# Patient Record
Sex: Male | Born: 1975 | Race: Black or African American | Hispanic: No | Marital: Single | Smoking: Current every day smoker
Health system: Southern US, Community
[De-identification: ages and names within clinical notes are randomized; demographics above are authoritative.]

## PROBLEM LIST (undated history)

## (undated) DIAGNOSIS — B20 Human immunodeficiency virus [HIV] disease: Secondary | ICD-10-CM

## (undated) DIAGNOSIS — Z21 Asymptomatic human immunodeficiency virus [HIV] infection status: Secondary | ICD-10-CM

## (undated) HISTORY — DX: Human immunodeficiency virus (HIV) disease: B20

## (undated) HISTORY — PX: OTHER SURGICAL HISTORY: SHX169

## (undated) HISTORY — DX: Asymptomatic human immunodeficiency virus (hiv) infection status: Z21

---

## 2004-02-11 ENCOUNTER — Encounter (INDEPENDENT_AMBULATORY_CARE_PROVIDER_SITE_OTHER): Payer: Self-pay | Admitting: *Deleted

## 2004-02-11 ENCOUNTER — Encounter: Admission: RE | Admit: 2004-02-11 | Discharge: 2004-02-11 | Payer: Self-pay | Admitting: Infectious Diseases

## 2004-02-11 LAB — CONVERTED CEMR LAB: CD4 T Cell Abs: 290

## 2004-03-22 ENCOUNTER — Ambulatory Visit: Payer: Self-pay | Admitting: Infectious Diseases

## 2004-04-22 ENCOUNTER — Ambulatory Visit: Payer: Self-pay | Admitting: Infectious Diseases

## 2004-05-13 ENCOUNTER — Ambulatory Visit: Payer: Self-pay | Admitting: Infectious Diseases

## 2004-05-31 ENCOUNTER — Ambulatory Visit: Payer: Self-pay | Admitting: Infectious Diseases

## 2004-06-14 ENCOUNTER — Ambulatory Visit: Payer: Self-pay | Admitting: Infectious Diseases

## 2004-07-12 ENCOUNTER — Ambulatory Visit: Payer: Self-pay | Admitting: Infectious Diseases

## 2004-08-11 ENCOUNTER — Ambulatory Visit: Payer: Self-pay | Admitting: Infectious Diseases

## 2004-08-23 ENCOUNTER — Ambulatory Visit: Payer: Self-pay | Admitting: Infectious Diseases

## 2004-09-29 ENCOUNTER — Ambulatory Visit: Payer: Self-pay | Admitting: Infectious Diseases

## 2004-11-10 ENCOUNTER — Ambulatory Visit: Payer: Self-pay | Admitting: Infectious Diseases

## 2004-11-29 ENCOUNTER — Ambulatory Visit: Payer: Self-pay | Admitting: Infectious Diseases

## 2005-02-21 ENCOUNTER — Ambulatory Visit: Payer: Self-pay | Admitting: Infectious Diseases

## 2005-04-13 ENCOUNTER — Ambulatory Visit: Payer: Self-pay | Admitting: Infectious Diseases

## 2005-05-10 ENCOUNTER — Ambulatory Visit: Payer: Self-pay | Admitting: Infectious Diseases

## 2005-08-09 ENCOUNTER — Ambulatory Visit: Payer: Self-pay | Admitting: Infectious Diseases

## 2005-11-01 ENCOUNTER — Ambulatory Visit: Payer: Self-pay | Admitting: Infectious Diseases

## 2006-01-24 ENCOUNTER — Encounter (INDEPENDENT_AMBULATORY_CARE_PROVIDER_SITE_OTHER): Payer: Self-pay | Admitting: *Deleted

## 2006-01-24 ENCOUNTER — Ambulatory Visit: Payer: Self-pay | Admitting: Infectious Diseases

## 2006-01-24 LAB — CONVERTED CEMR LAB: CD4 Count: 466 microliters

## 2006-02-23 ENCOUNTER — Emergency Department (HOSPITAL_COMMUNITY): Admission: EM | Admit: 2006-02-23 | Discharge: 2006-02-23 | Payer: Self-pay | Admitting: Family Medicine

## 2006-02-23 ENCOUNTER — Ambulatory Visit (HOSPITAL_COMMUNITY): Admission: RE | Admit: 2006-02-23 | Discharge: 2006-02-23 | Payer: Self-pay | Admitting: Family Medicine

## 2006-04-18 ENCOUNTER — Ambulatory Visit: Payer: Self-pay | Admitting: Infectious Diseases

## 2006-04-18 ENCOUNTER — Encounter (INDEPENDENT_AMBULATORY_CARE_PROVIDER_SITE_OTHER): Payer: Self-pay | Admitting: *Deleted

## 2006-04-18 LAB — CONVERTED CEMR LAB
ALT: 24 units/L (ref 0–40)
AST: 19 units/L (ref 0–37)
Albumin: 4.2 g/dL (ref 3.5–5.2)
Alkaline Phosphatase: 71 units/L (ref 39–117)
BUN: 13 mg/dL (ref 6–23)
Bilirubin Urine: NEGATIVE
Bilirubin, Direct: 0.1 mg/dL (ref 0.0–0.3)
CD4 Count: 645 microliters
CO2: 26 meq/L (ref 19–32)
Calcium: 9.3 mg/dL (ref 8.4–10.5)
Chloride: 106 meq/L (ref 96–112)
Cholesterol: 188 mg/dL (ref 0–200)
Creatinine, Ser: 0.85 mg/dL (ref 0.40–1.50)
Creatinine, Urine: 267 mg/dL
Glucose, Bld: 88 mg/dL (ref 70–99)
HCV Ab: NEGATIVE
HDL: 51 mg/dL (ref >39)
HIV 1 RNA Quant: 49 copies/mL
Hep A Total Ab: NEGATIVE
Hep B Core Total Ab: NEGATIVE
Hepatitis B Surface Ag: NEGATIVE
Hgb urine dipstick: NEGATIVE
Indirect Bilirubin: 0.2 mg/dL (ref 0.0–0.9)
Ketones, ur: NEGATIVE mg/dL
LDL Cholesterol: 124 mg/dL — ABNORMAL HIGH (ref 0–99)
Nitrite: NEGATIVE
Phosphorus: 3.5 mg/dL (ref 2.3–4.6)
Potassium: 4.2 meq/L (ref 3.5–5.3)
Protein, ur: NEGATIVE mg/dL
Sodium: 140 meq/L (ref 135–145)
Specific Gravity, Urine: 1.029 (ref 1.005–1.03)
Total Bilirubin: 0.3 mg/dL (ref 0.3–1.2)
Total CHOL/HDL Ratio: 3.7
Total Protein, Urine: 13
Total Protein: 6.9 g/dL (ref 6.0–8.3)
Triglycerides: 66 mg/dL (ref <150)
Urine Glucose: NEGATIVE mg/dL
Urobilinogen, UA: 0.2 (ref 0.0–1.0)
VLDL: 13 mg/dL (ref 0–40)
WBC Urine, dipstick: NEGATIVE
pH: 6 (ref 5.0–8.0)

## 2006-07-16 ENCOUNTER — Encounter (INDEPENDENT_AMBULATORY_CARE_PROVIDER_SITE_OTHER): Payer: Self-pay | Admitting: *Deleted

## 2006-07-16 ENCOUNTER — Ambulatory Visit: Payer: Self-pay | Admitting: Infectious Diseases

## 2006-07-16 LAB — CONVERTED CEMR LAB
AST: 19 units/L (ref 0–37)
Albumin: 4.2 g/dL (ref 3.5–5.2)
CO2: 25 meq/L (ref 19–32)
Chloride: 105 meq/L (ref 96–112)
HDL: 43 mg/dL (ref >39)
LDL Cholesterol: 102 mg/dL — ABNORMAL HIGH (ref 0–99)
Sodium: 141 meq/L (ref 135–145)
Total Bilirubin: 0.2 mg/dL — ABNORMAL LOW (ref 0.3–1.2)
Total CHOL/HDL Ratio: 3.9
Total Protein: 6.9 g/dL (ref 6.0–8.3)

## 2006-08-20 ENCOUNTER — Encounter (INDEPENDENT_AMBULATORY_CARE_PROVIDER_SITE_OTHER): Payer: Self-pay | Admitting: *Deleted

## 2006-08-20 LAB — CONVERTED CEMR LAB

## 2006-09-02 ENCOUNTER — Encounter (INDEPENDENT_AMBULATORY_CARE_PROVIDER_SITE_OTHER): Payer: Self-pay | Admitting: *Deleted

## 2006-10-11 ENCOUNTER — Ambulatory Visit: Payer: Self-pay | Admitting: Infectious Diseases

## 2006-10-11 DIAGNOSIS — B20 Human immunodeficiency virus [HIV] disease: Secondary | ICD-10-CM | POA: Insufficient documentation

## 2006-12-25 ENCOUNTER — Ambulatory Visit: Payer: Self-pay | Admitting: Infectious Diseases

## 2007-03-21 ENCOUNTER — Ambulatory Visit: Payer: Self-pay | Admitting: Infectious Diseases

## 2007-06-12 ENCOUNTER — Ambulatory Visit: Payer: Self-pay | Admitting: Infectious Diseases

## 2007-06-25 ENCOUNTER — Encounter (INDEPENDENT_AMBULATORY_CARE_PROVIDER_SITE_OTHER): Payer: Self-pay | Admitting: *Deleted

## 2007-06-25 ENCOUNTER — Encounter: Payer: Self-pay | Admitting: Infectious Diseases

## 2007-08-28 ENCOUNTER — Ambulatory Visit: Payer: Self-pay | Admitting: Infectious Diseases

## 2007-12-09 ENCOUNTER — Ambulatory Visit: Payer: Self-pay | Admitting: Infectious Diseases

## 2008-01-03 ENCOUNTER — Encounter: Payer: Self-pay | Admitting: Infectious Diseases

## 2008-02-24 ENCOUNTER — Ambulatory Visit: Payer: Self-pay | Admitting: Infectious Diseases

## 2008-02-25 ENCOUNTER — Ambulatory Visit: Payer: Self-pay | Admitting: Infectious Diseases

## 2008-06-03 ENCOUNTER — Ambulatory Visit: Payer: Self-pay | Admitting: Internal Medicine

## 2008-06-03 ENCOUNTER — Ambulatory Visit: Payer: Self-pay | Admitting: Infectious Diseases

## 2008-09-09 ENCOUNTER — Ambulatory Visit: Payer: Self-pay | Admitting: Infectious Diseases

## 2008-09-09 LAB — CONVERTED CEMR LAB
ALT: 17 units/L (ref 0–53)
AST: 19 units/L (ref 0–37)
Albumin: 4 g/dL (ref 3.5–5.2)
Alkaline Phosphatase: 81 units/L (ref 39–117)
Basophils Absolute: 0 10*3/uL (ref 0.0–0.1)
Glucose, Bld: 85 mg/dL (ref 70–99)
Hemoglobin: 13.3 g/dL (ref 13.0–17.0)
LDL Cholesterol: 95 mg/dL (ref 0–99)
Lymphocytes Relative: 42 % (ref 12–46)
Monocytes Absolute: 0.5 10*3/uL (ref 0.1–1.0)
Neutro Abs: 3.1 10*3/uL (ref 1.7–7.7)
Neutrophils Relative %: 49 % (ref 43–77)
Potassium: 4 meq/L (ref 3.5–5.3)
RBC: 5.35 M/uL (ref 4.22–5.81)
RDW: 16.1 % — ABNORMAL HIGH (ref 11.5–15.5)
Sodium: 138 meq/L (ref 135–145)
Total Bilirubin: 0.4 mg/dL (ref 0.3–1.2)
Total Protein: 6.8 g/dL (ref 6.0–8.3)
Triglycerides: 77 mg/dL (ref <150)
VLDL: 15 mg/dL (ref 0–40)

## 2009-04-09 ENCOUNTER — Ambulatory Visit: Payer: Self-pay | Admitting: Infectious Diseases

## 2009-04-09 ENCOUNTER — Encounter: Payer: Self-pay | Admitting: Infectious Diseases

## 2009-04-09 LAB — CONVERTED CEMR LAB
ALT: 20 units/L (ref 0–53)
AST: 23 units/L (ref 0–37)
Albumin: 4.4 g/dL (ref 3.5–5.2)
CO2: 24 meq/L (ref 19–32)
Calcium: 9.1 mg/dL (ref 8.4–10.5)
Chloride: 107 meq/L (ref 96–112)
Cholesterol: 171 mg/dL (ref 0–200)
Creatinine, Urine: 191 mg/dL
HIV 1 RNA Quant: 39 copies/mL
Potassium: 4.2 meq/L (ref 3.5–5.3)
Sodium: 140 meq/L (ref 135–145)
Total CHOL/HDL Ratio: 4.4
Total Protein, Urine: 9
Total Protein: 7 g/dL (ref 6.0–8.3)

## 2009-09-06 ENCOUNTER — Ambulatory Visit: Payer: Self-pay | Admitting: Infectious Diseases

## 2009-09-06 LAB — CONVERTED CEMR LAB
ALT: 24 units/L (ref 0–53)
AST: 27 units/L (ref 0–37)
Basophils Relative: 0 % (ref 0–1)
Chloride: 107 meq/L (ref 96–112)
Creatinine, Ser: 0.79 mg/dL (ref 0.40–1.50)
Eosinophils Absolute: 0.1 10*3/uL (ref 0.0–0.7)
LDL Cholesterol: 109 mg/dL — ABNORMAL HIGH (ref 0–99)
Lymphs Abs: 1.9 10*3/uL (ref 0.7–4.0)
MCV: 73.9 fL — ABNORMAL LOW (ref >=78.0)
Monocytes Relative: 7 % (ref 3–12)
Neutro Abs: 4.4 10*3/uL (ref 1.7–7.7)
Neutrophils Relative %: 63 % (ref 43–77)
Platelets: 337 10*3/uL (ref 150–400)
RBC: 5.56 M/uL (ref 4.22–5.81)
Sodium: 141 meq/L (ref 135–145)
Total Bilirubin: 0.3 mg/dL (ref 0.3–1.2)
Total CHOL/HDL Ratio: 3.5
Total Protein: 7 g/dL (ref 6.0–8.3)
VLDL: 12 mg/dL (ref 0–40)
WBC: 7 10*3/uL (ref 4.0–10.5)

## 2009-12-09 ENCOUNTER — Ambulatory Visit: Payer: Self-pay | Admitting: Infectious Diseases

## 2009-12-09 ENCOUNTER — Encounter: Payer: Self-pay | Admitting: Infectious Diseases

## 2009-12-09 LAB — CONVERTED CEMR LAB
ALT: 17 units/L (ref 0–53)
AST: 19 units/L (ref 0–37)
Alkaline Phosphatase: 106 units/L (ref 39–117)
Cholesterol: 166 mg/dL (ref 0–200)
Creatinine, Ser: 0.9 mg/dL (ref 0.40–1.50)
Creatinine, Urine: 232.5 mg/dL
HIV 1 RNA Quant: 39 copies/mL
Hep B Core Total Ab: NEGATIVE
Hepatitis B Surface Ag: NEGATIVE
LDL Cholesterol: 92 mg/dL (ref 0–99)
Sodium: 140 meq/L (ref 135–145)
Total Bilirubin: 0.4 mg/dL (ref 0.3–1.2)
Total CHOL/HDL Ratio: 2.9
Total Protein, Urine: 20
VLDL: 17 mg/dL (ref 0–40)

## 2010-05-30 ENCOUNTER — Encounter: Payer: Self-pay | Admitting: Infectious Diseases

## 2010-05-30 ENCOUNTER — Ambulatory Visit: Payer: Self-pay | Admitting: Infectious Diseases

## 2010-05-30 LAB — CONVERTED CEMR LAB
AST: 23 units/L (ref 0–37)
Albumin: 4.2 g/dL (ref 3.5–5.2)
BUN: 11 mg/dL (ref 6–23)
CD4 Count: 565 microliters
CO2: 24 meq/L (ref 19–32)
Calcium: 8.8 mg/dL (ref 8.4–10.5)
Chloride: 104 meq/L (ref 96–112)
Cholesterol: 176 mg/dL (ref 0–200)
HDL: 43 mg/dL (ref >39)
HIV 1 RNA Quant: 34262 copies/mL
Potassium: 4.2 meq/L (ref 3.5–5.3)

## 2010-07-06 ENCOUNTER — Encounter (INDEPENDENT_AMBULATORY_CARE_PROVIDER_SITE_OTHER): Payer: Self-pay | Admitting: *Deleted

## 2010-07-24 LAB — CONVERTED CEMR LAB
ALT: 17 units/L (ref 0–53)
ALT: 19 units/L (ref 0–53)
ALT: 20 units/L (ref 0–53)
ALT: 20 units/L (ref 0–53)
ALT: 21 units/L (ref 0–53)
AST: 20 units/L (ref 0–37)
AST: 20 units/L (ref 0–37)
AST: 21 units/L (ref 0–37)
AST: 28 units/L (ref 0–37)
Albumin: 3.9 g/dL (ref 3.5–5.2)
Albumin: 4.2 g/dL (ref 3.5–5.2)
Albumin: 4.2 g/dL (ref 3.5–5.2)
Albumin: 4.3 g/dL (ref 3.5–5.2)
Albumin: 4.3 g/dL (ref 3.5–5.2)
Albumin: 4.3 g/dL (ref 3.5–5.2)
Albumin: 4.5 g/dL (ref 3.5–5.2)
Alkaline Phosphatase: 78 units/L (ref 39–117)
Alkaline Phosphatase: 88 units/L (ref 39–117)
Alkaline Phosphatase: 89 units/L (ref 39–117)
Alkaline Phosphatase: 96 units/L (ref 39–117)
BUN: 11 mg/dL (ref 6–23)
BUN: 13 mg/dL (ref 6–23)
BUN: 15 mg/dL (ref 6–23)
BUN: 18 mg/dL (ref 6–23)
BUN: 19 mg/dL (ref 6–23)
Bilirubin Urine: NEGATIVE
Bilirubin Urine: NEGATIVE
Bilirubin Urine: NEGATIVE
Bilirubin Urine: NEGATIVE
Bilirubin, Direct: 0.1 mg/dL (ref 0.0–0.3)
Bilirubin, Direct: 0.1 mg/dL (ref 0.0–0.3)
Bilirubin, Direct: <0.1 mg/dL (ref 0.0–0.3)
Bilirubin, Direct: <0.1 mg/dL (ref 0.0–0.3)
CD4 Count: 549 microliters
CD4 Count: 618 microliters
CD4 Count: 626 microliters
CD4 Count: 840 microliters
CD4 Count: 876 microliters
CO2: 22 meq/L (ref 19–32)
CO2: 24 meq/L (ref 19–32)
CO2: 24 meq/L (ref 19–32)
CO2: 24 meq/L (ref 19–32)
CO2: 25 meq/L (ref 19–32)
CO2: 27 meq/L (ref 19–32)
Calcium: 8.7 mg/dL (ref 8.4–10.5)
Calcium: 8.9 mg/dL (ref 8.4–10.5)
Calcium: 9.1 mg/dL (ref 8.4–10.5)
Calcium: 9.1 mg/dL (ref 8.4–10.5)
Chloride: 105 meq/L (ref 96–112)
Chloride: 105 meq/L (ref 96–112)
Chloride: 106 meq/L (ref 96–112)
Chloride: 106 meq/L (ref 96–112)
Chloride: 108 meq/L (ref 96–112)
Chloride: 108 meq/L (ref 96–112)
Cholesterol: 154 mg/dL (ref 0–200)
Cholesterol: 158 mg/dL (ref 0–200)
Cholesterol: 176 mg/dL (ref 0–200)
Cholesterol: 177 mg/dL (ref 0–200)
Creatinine, Ser: 0.68 mg/dL (ref 0.40–1.50)
Creatinine, Ser: 0.7 mg/dL (ref 0.40–1.50)
Creatinine, Ser: 0.72 mg/dL (ref 0.40–1.50)
Creatinine, Ser: 0.77 mg/dL (ref 0.40–1.50)
Creatinine, Ser: 0.8 mg/dL (ref 0.40–1.50)
Creatinine, Urine: 185.9 mg/dL
Glucose, Bld: 100 mg/dL — ABNORMAL HIGH (ref 70–99)
Glucose, Bld: 71 mg/dL (ref 70–99)
Glucose, Bld: 86 mg/dL (ref 70–99)
Glucose, Bld: 88 mg/dL (ref 70–99)
Glucose, Bld: 91 mg/dL (ref 70–99)
Glucose, Bld: 95 mg/dL (ref 70–99)
HCV Ab: NEGATIVE
HDL: 46 mg/dL (ref >39)
HDL: 47 mg/dL (ref >39)
HDL: 58 mg/dL (ref >39)
HDL: 59 mg/dL (ref >39)
HIV 1 RNA Quant: 39 copies/mL
HIV 1 RNA Quant: 49 copies/mL
HIV 1 RNA Quant: 49 copies/mL
HIV 1 RNA Quant: 49 copies/mL
HIV 1 RNA Quant: 49 copies/mL
Hemoglobin, Urine: NEGATIVE
Hemoglobin, Urine: NEGATIVE
Hemoglobin, Urine: NEGATIVE
Hepatitis B Surface Ag: NEGATIVE
Indirect Bilirubin: 0.2 mg/dL (ref 0.0–0.9)
Indirect Bilirubin: 0.2 mg/dL (ref 0.0–0.9)
Indirect Bilirubin: 0.3 mg/dL (ref 0.0–0.9)
Ketones, ur: NEGATIVE mg/dL
Ketones, ur: NEGATIVE mg/dL
LDL Cholesterol: 104 mg/dL — ABNORMAL HIGH (ref 0–99)
LDL Cholesterol: 106 mg/dL — ABNORMAL HIGH (ref 0–99)
LDL Cholesterol: 85 mg/dL (ref 0–99)
LDL Cholesterol: 93 mg/dL (ref 0–99)
LDL Cholesterol: 99 mg/dL (ref 0–99)
LDL Cholesterol: 99 mg/dL (ref 0–99)
Leukocytes, UA: NEGATIVE
Leukocytes, UA: NEGATIVE
Nitrite: NEGATIVE
Nitrite: NEGATIVE
Phosphorus: 3.1 mg/dL (ref 2.3–4.6)
Phosphorus: 3.7 mg/dL (ref 2.3–4.6)
Phosphorus: 3.9 mg/dL (ref 2.3–4.6)
Phosphorus: 4.1 mg/dL (ref 2.3–4.6)
Potassium: 4 meq/L (ref 3.5–5.3)
Potassium: 4.2 meq/L (ref 3.5–5.3)
Potassium: 4.3 meq/L (ref 3.5–5.3)
Potassium: 4.3 meq/L (ref 3.5–5.3)
Potassium: 4.4 meq/L (ref 3.5–5.3)
Potassium: 4.5 meq/L (ref 3.5–5.3)
Potassium: 4.5 meq/L (ref 3.5–5.3)
Protein, ur: NEGATIVE mg/dL
Protein, ur: NEGATIVE mg/dL
Protein, ur: NEGATIVE mg/dL
Sodium: 139 meq/L (ref 135–145)
Sodium: 140 meq/L (ref 135–145)
Sodium: 140 meq/L (ref 135–145)
Sodium: 140 meq/L (ref 135–145)
Sodium: 143 meq/L (ref 135–145)
Specific Gravity, Urine: 1.025 (ref 1.005–1.03)
Specific Gravity, Urine: 1.029 (ref 1.005–1.03)
Specific Gravity, Urine: 1.033 — ABNORMAL HIGH (ref 1.005–1.03)
Total Bilirubin: 0.2 mg/dL — ABNORMAL LOW (ref 0.3–1.2)
Total Bilirubin: 0.3 mg/dL (ref 0.3–1.2)
Total Bilirubin: 0.3 mg/dL (ref 0.3–1.2)
Total Bilirubin: 0.3 mg/dL (ref 0.3–1.2)
Total Bilirubin: 0.4 mg/dL (ref 0.3–1.2)
Total CHOL/HDL Ratio: 3
Total CHOL/HDL Ratio: 3.3
Total CHOL/HDL Ratio: 3.3
Total CHOL/HDL Ratio: 3.4
Total Protein: 6.7 g/dL (ref 6.0–8.3)
Total Protein: 6.9 g/dL (ref 6.0–8.3)
Total Protein: 6.9 g/dL (ref 6.0–8.3)
Total Protein: 7.1 g/dL (ref 6.0–8.3)
Total Protein: 7.4 g/dL (ref 6.0–8.3)
Triglycerides: 128 mg/dL (ref <150)
Triglycerides: 46 mg/dL (ref <150)
Triglycerides: 66 mg/dL (ref <150)
Urine Glucose: NEGATIVE mg/dL
Urine Glucose: NEGATIVE mg/dL
Urine Glucose: NEGATIVE mg/dL
Urine Glucose: NEGATIVE mg/dL
Urobilinogen, UA: 0.2 (ref 0.0–1.0)
Urobilinogen, UA: 0.2 (ref 0.0–1.0)
Urobilinogen, UA: 1 (ref 0.0–1.0)
VLDL: 10 mg/dL (ref 0–40)
VLDL: 13 mg/dL (ref 0–40)
VLDL: 24 mg/dL (ref 0–40)
VLDL: 26 mg/dL (ref 0–40)
VLDL: 9 mg/dL (ref 0–40)
VLDL: 9 mg/dL (ref 0–40)
pH: 6 (ref 5.0–8.0)
pH: 6 (ref 5.0–8.0)
pH: 7.5 (ref 5.0–8.0)

## 2010-07-27 NOTE — Assessment & Plan Note (Addendum)
Summary: FLU SHOT  Prior Medications: ATRIPLA 600-200-300 MG TABS (EFAVIRENZ-EMTRICITAB-TENOFOVIR) take 1 by mouth daily on an empty stomach  Immunizations Administered:  Influenza Vaccine # 1:    Vaccine Type: Fluvax Non-MCR    Site: left deltoid    Mfr: novartis    Dose: 0.5 ml    Route: IM    Given by: Deirdre Evener RN    Exp. Date: 09/25/2010    Lot #: [id number]    VIS given: 01/18/10 version given May 30, 2010.  Flu Vaccine Consent Questions:    Do you have a history of severe allergic reactions to this vaccine? no    Any prior history of allergic reactions to egg and/or gelatin? no    Do you have a sensitivity to the preservative Thimersol? no    Do you have a past history of Guillan-Barre Syndrome? no    Do you currently have an acute febrile illness? no    Have you ever had a severe reaction to latex? no    Vaccine information given and explained to patient? yes  Orders Added: 1)  Influenza Vaccine NON MCR [00028]

## 2010-07-27 NOTE — Assessment & Plan Note (Signed)
Summary: STUDY APPT/ LH   Vital Signs:  Patient profile:   35 year old male Weight:      127.25 pounds (57.84 kg) BMI:     20.30 Temp:     97.9 degrees F oral Pulse rate:   80 / minute BP sitting:   117 / 67  (left arm) Is Patient Diabetic? No Nutritional Status BMI of 19 -24 = normal  Does patient need assistance? Functional Status Self care Ambulation Normal   Patient here for week 304 ALLRT study visit. He denies any current complaints. He has been without his atripla for the past month due to financial reasons. I gave him a co-pay assistance card to use for his copays that should cover the full amount $100/month.Deirdre Evener RN  May 30, 2010 10:34 AM    Other Orders: Est. Patient Research Study [phone]) T-Comprehensive Metabolic Panel [id number]) T-Lipid Profile [id number])

## 2010-07-27 NOTE — Miscellaneous (Signed)
Summary: HIV-1 RNA, CD4 (RESEARCH)  Clinical Lists Changes  Observations: Added new observation of CD4 COUNT: 862 microliters (12/09/2009 9:22) Added new observation of HIV1RNA QA: 39 copies/mL (12/09/2009 9:22)

## 2010-07-27 NOTE — Assessment & Plan Note (Signed)
Summary: STUDY APPT/ LH   Vital Signs:  Patient profile:   35 year old male Height:      66.5 inches (168.91 cm) Weight:      122.75 pounds (55.80 kg) BMI:     19.59 Temp:     98.0 degrees F oral Pulse rate:   94 / minute BP sitting:   129 / 77  (left arm) Is Patient Diabetic? No Pain Assessment Patient in pain? no      Nutritional Status BMI of 19 -24 = normal  Does patient need assistance? Functional Status Self care Ambulation Normal   Patient here for week 288 ALLRT study visit. He denies any major problems, but has started having cold signs yesterday of cough, nasal stuffiness and headache. He has been very adherent.Deirdre Evener RN  December 09, 2009 2:30 PM    Other Orders: Est. Patient Research Study [id number]) T-Comprehensive Metabolic Panel [id number]) T-Lipid Profile [phone]) T-Urine Protein [id number]) T-Urine Creatinine [id number]) T-Hepatitis B Surface Antigen [phone]) T-Hepatitis B Core Antibody [id number]) T-Hepatitis C Antibody ([id number]) T-Hepatitis A Antibody ([id number])  Process Orders Check Orders Results:     [facility]: ABN not required for this insurance Order queued for requisitioning for [facility]: December 09, 2009 2:10 PM  Tests Sent for requisitioning (December 09, 2009 2:10 PM):     12/09/2009: [facility] -- T-Comprehensive Metabolic Panel [[id number]] (signed)     12/09/2009: [facility] -- T-Lipid Profile [phone] (signed)     12/09/2009: [facility] -- T-Urine Protein [phone] (signed)     12/09/2009: [facility] -- T-Urine Creatinine [[id number]] (signed)     12/09/2009: [facility] -- T-Hepatitis B Surface Antigen [[id number]] (signed)     12/09/2009: [facility] -- T-Hepatitis B Core Antibody [[id number]] (signed)     12/09/2009: [facility] -- T-Hepatitis C Antibody [[id number]]  (signed)     12/09/2009: [facility] -- T-Hepatitis A Antibody [[id number]] (signed)

## 2010-07-27 NOTE — Assessment & Plan Note (Signed)
Summary: STUDY APPT/ LH   Vital Signs:  Patient profile:   35 year old male Weight:      123.3 pounds (56.05 kg) BMI:     19.97 Temp:     97.0 degrees F oral Pulse rate:   95 / minute BP sitting:   121 / 74  (right arm) Is Patient Diabetic? No Research Study Name: ALLRT Pain Assessment Patient in pain? no      Nutritional Status BMI of 19 -24 = normal  Does patient need assistance? Functional Status Self care Ambulation Normal  Patient here for week 272 ALLRT study visit. Denies any problems or complaints. He has not been seen by Dr. Maurice March in over a year, so He has scheduled a followup appt in June, a few days after his next research appt.Deirdre Evener RN  September 06, 2009 1:26 PM   Other Orders: Est. Patient Research Study [id number]) T-Comprehensive Metabolic Panel [phone]) T-CBC w/Diff [id number]) T-Lipid Profile [id number]) Process Orders Check Orders Results:     [facility]: ABN not required for this insurance Tests Sent for requisitioning (September 06, 2009 1:23 PM):     09/06/2009: [facility] -- T-Comprehensive Metabolic Panel [[id number]] (signed)     09/06/2009: [facility] -- T-CBC w/Diff [[id number]] (signed)     09/06/2009: [facility] -- T-Lipid Profile [phone] (signed)

## 2010-07-27 NOTE — Assessment & Plan Note (Signed)
Summary: F/U   CC:  follow-up visit.  Preventive Screening-Counseling & Management  Alcohol-Tobacco     Alcohol drinks/day: <1     Smoking Status: current     Smoking Cessation Counseling: yes     Packs/Day: 1/2  Caffeine-Diet-Exercise     Caffeine use/day: yes     Does Patient Exercise: yes     Type of exercise: lifting     Exercise (avg: min/session): >60     Times/week: 5  Hep-HIV-STD-Contraception     HIV Risk: no risk noted  Safety-Violence-Falls     Seat Belt Use: no  Comments: declined condoms      Sexual History:  n/a.        Drug Use:  never.    Social History: Sexual History:  n/a Drug Use:  never  Vital Signs:  Patient profile:   35 year old male Height:      66.5 inches (168.91 cm) Weight:      121.5 pounds (55.23 kg) BMI:     19.39 Temp:     98.0 degrees F (36.67 degrees C) oral Pulse rate:   96 / minute BP sitting:   135 / 89  (left arm) Cuff size:   regular  Vitals Entered By: Jennet Maduro RN (December 09, 2009 3:15 PM) CC: follow-up visit Is Patient Diabetic? No Pain Assessment Patient in pain? no      Nutritional Status BMI of 19 -24 = normal Nutritional Status Detail appetite "good"  Have you ever been in a relationship where you felt threatened, hurt or afraid?No   Does patient need assistance? Functional Status Self care Ambulation Normal Comments no missed doses of rxes    Other Orders: T-RPR (Syphilis) ([id number]) Est. Patient Level IV (82956) Future Orders: T-CD4SP ([facility]) (CD4SP) ... 05/30/2010 T-HIV Viral Load [id number]) ... 05/30/2010  Patient Instructions: 1)  Please schedule a follow-up appointment in 5-6 months. 2)  Be sure to return for lab work one (1) week before your next appointment as scheduled.   Process Orders Check Orders Results:     [facility]: ABN not required for this insurance Tests Sent for requisitioning (May 30, 2010 11:10 AM):     12/09/2009: [facility] -- T-RPR (Syphilis) [id number] (signed)     05/30/2010: [facility] -- T-HIV Viral Load [phone] (signed)

## 2010-07-28 NOTE — Miscellaneous (Signed)
Summary: HIV-1 RNA, CD4 (RESEARCH)  Clinical Lists Changes  Observations: Added new observation of CD4 COUNT: 565 microliters (05/30/2010 15:14) Added new observation of HIV1RNA QA: 34,262 copies/mL (05/30/2010 15:14)

## 2010-07-28 NOTE — Miscellaneous (Signed)
  Clinical Lists Changes 

## 2010-09-05 ENCOUNTER — Ambulatory Visit: Payer: Self-pay

## 2010-09-06 ENCOUNTER — Encounter: Payer: Self-pay | Admitting: Infectious Diseases

## 2010-09-06 ENCOUNTER — Ambulatory Visit: Payer: Self-pay

## 2010-09-13 NOTE — Assessment & Plan Note (Signed)
Summary: STUDY APPT   Vital Signs:  Patient profile:   35 year old male Weight:      121.75 pounds (55.05 kg) Temp:     98.1 degrees F oral Pulse rate:   93 / minute BP sitting:   139 / 92  (right arm)  Vitals Entered By: Tacey Heap RN (September 06, 2010 11:38 AM)  09/06/10 @ 1100: Pt here for study A5001, visit 320. Pt assessment unchanged since last study visit. Pt denies any new dx, medications, signs, or symptoms. Pt stated financial issues have improved. Labs were drawn with no problems. Pt received $20.00 for visit. Next study appointment scheduled for Monday, December 12, 2010 at 9:30am. Tacey Heap RN  September 06, 2010 11:43 AM     Other Orders: T-Comprehensive Metabolic Panel [id number] [id number]) T-Lipid Profile [id number]) T-CBC w/Diff [id number])   Orders Added: 1)  T-Comprehensive Metabolic Panel [[id number]] 2)  T-Lipid Profile [[id number]] 3)  T-CBC w/Diff [[id number]]

## 2010-11-07 ENCOUNTER — Other Ambulatory Visit: Payer: Self-pay | Admitting: Infectious Diseases

## 2010-11-07 DIAGNOSIS — B20 Human immunodeficiency virus [HIV] disease: Secondary | ICD-10-CM

## 2010-12-19 ENCOUNTER — Encounter: Payer: Self-pay | Admitting: *Deleted

## 2010-12-19 ENCOUNTER — Ambulatory Visit (INDEPENDENT_AMBULATORY_CARE_PROVIDER_SITE_OTHER): Payer: Self-pay | Admitting: *Deleted

## 2010-12-19 VITALS — BP 133/88 | HR 86 | Temp 98.1°F | Wt 125.5 lb

## 2010-12-19 DIAGNOSIS — B20 Human immunodeficiency virus [HIV] disease: Secondary | ICD-10-CM

## 2010-12-19 LAB — COMPREHENSIVE METABOLIC PANEL
ALT: 16 U/L (ref 0–53)
AST: 16 U/L (ref 0–37)
Albumin: 4.1 g/dL (ref 3.5–5.2)
BUN: 11 mg/dL (ref 6–23)
Calcium: 9.1 mg/dL (ref 8.4–10.5)
Chloride: 107 mEq/L (ref 96–112)
Potassium: 4 mEq/L (ref 3.5–5.3)

## 2010-12-19 NOTE — Progress Notes (Signed)
Patient here for week 336 ALLRT study appointment. He denies any problems at present and assessment is WNL. He started back on his meds the first of April. I instructed him to make an appt with the MD since it had been 1 year since his last appointment.

## 2010-12-20 LAB — CREATININE, URINE, RANDOM: Creatinine, Urine: 262.8 mg/dL

## 2010-12-20 LAB — PROTEIN, URINE, RANDOM: Total Protein, Urine: 14 mg/dL

## 2010-12-29 ENCOUNTER — Encounter: Payer: Self-pay | Admitting: Infectious Diseases

## 2010-12-29 LAB — HIV-1 RNA QUANT-NO REFLEX-BLD: HIV-1 RNA Viral Load: <40

## 2010-12-29 LAB — CD4/CD8 (T-HELPER/T-SUPPRESSOR CELL)
CD4%: 34.9
CD4: 593

## 2011-04-03 ENCOUNTER — Encounter: Payer: Self-pay | Admitting: *Deleted

## 2011-04-03 ENCOUNTER — Ambulatory Visit (INDEPENDENT_AMBULATORY_CARE_PROVIDER_SITE_OTHER): Payer: Self-pay | Admitting: *Deleted

## 2011-04-03 DIAGNOSIS — B20 Human immunodeficiency virus [HIV] disease: Secondary | ICD-10-CM

## 2011-04-03 DIAGNOSIS — Z299 Encounter for prophylactic measures, unspecified: Secondary | ICD-10-CM

## 2011-04-03 DIAGNOSIS — Z23 Encounter for immunization: Secondary | ICD-10-CM

## 2011-04-03 LAB — LIPID PANEL
Cholesterol: 178 mg/dL (ref 0–200)
HDL: 42 mg/dL (ref >39)
Total CHOL/HDL Ratio: 4.2 Ratio
Triglycerides: 88 mg/dL (ref <150)
VLDL: 18 mg/dL (ref 0–40)

## 2011-04-03 LAB — COMPREHENSIVE METABOLIC PANEL
BUN: 12 mg/dL (ref 6–23)
CO2: 24 mEq/L (ref 19–32)
Calcium: 9.3 mg/dL (ref 8.4–10.5)
Chloride: 105 mEq/L (ref 96–112)
Creat: 0.7 mg/dL (ref 0.50–1.35)
Glucose, Bld: 75 mg/dL (ref 70–99)
Total Bilirubin: 0.3 mg/dL (ref 0.3–1.2)

## 2011-04-03 NOTE — Progress Notes (Signed)
04/03/2011 @ 1030: Pt here for research study A5001, week 352. Assessment unchanged since last study visit. Pt denies any new findings or problems. Vital signs stable; non-fasting labs drawn; Administered flu vaccine; Pt received $20.00 gift card for visit. Next appointment scheduled for Monday, July 24, 2011 at 1100 am. -- Tacey Heap RN II

## 2011-05-17 ENCOUNTER — Encounter: Payer: Self-pay | Admitting: Infectious Diseases

## 2011-05-17 LAB — CD4/CD8 (T-HELPER/T-SUPPRESSOR CELL)
CD4: 515
CD8 % Suppressor T Cell: 53.7

## 2011-08-01 ENCOUNTER — Emergency Department (HOSPITAL_COMMUNITY)
Admission: EM | Admit: 2011-08-01 | Discharge: 2011-08-02 | Disposition: A | Discharge status: Home / Self Care | Payer: BC Managed Care – PPO | Attending: Emergency Medicine | Admitting: Emergency Medicine

## 2011-08-01 ENCOUNTER — Encounter (HOSPITAL_COMMUNITY): Payer: Self-pay | Admitting: Emergency Medicine

## 2011-08-01 DIAGNOSIS — R109 Unspecified abdominal pain: Secondary | ICD-10-CM | POA: Insufficient documentation

## 2011-08-01 DIAGNOSIS — R197 Diarrhea, unspecified: Secondary | ICD-10-CM

## 2011-08-01 DIAGNOSIS — Z79899 Other long term (current) drug therapy: Secondary | ICD-10-CM | POA: Insufficient documentation

## 2011-08-01 DIAGNOSIS — F17200 Nicotine dependence, unspecified, uncomplicated: Secondary | ICD-10-CM | POA: Insufficient documentation

## 2011-08-01 DIAGNOSIS — R112 Nausea with vomiting, unspecified: Secondary | ICD-10-CM | POA: Insufficient documentation

## 2011-08-01 MED ORDER — SODIUM CHLORIDE 0.9 % IV BOLUS (SEPSIS)
1000.0000 mL | Freq: Once | INTRAVENOUS | Status: AC
  Administered 2011-08-01: 1000 mL via INTRAVENOUS

## 2011-08-01 MED ORDER — LOPERAMIDE HCL 2 MG PO CAPS
2.0000 mg | ORAL_CAPSULE | Freq: Once | ORAL | Status: AC
  Administered 2011-08-01: 2 mg via ORAL
  Filled 2011-08-01: qty 1

## 2011-08-01 MED ORDER — ONDANSETRON HCL 4 MG/2ML IJ SOLN
4.0000 mg | Freq: Once | INTRAMUSCULAR | Status: AC
  Administered 2011-08-01: 4 mg via INTRAVENOUS
  Filled 2011-08-01: qty 2

## 2011-08-01 NOTE — ED Provider Notes (Signed)
History     CSN: [id number]  Arrival date & time 08/01/11  4098   First MD Initiated Contact with Patient 08/01/11 2259      No chief complaint on file.   (Consider location/radiation/quality/duration/timing/severity/associated sxs/prior treatment) The history is provided by the patient.   nausea vomiting diarrhea for 2 days. Mostly diarrhea. No fevers. Mild abdominal pain. No fevers.  no blood in stool or vomiting. She he states he has had sick contacts. He does not feel dizzy.  History reviewed. No pertinent past medical history.  Past Surgical History  Procedure Date  . Heart surgery as an infant      History reviewed. No pertinent family history.  History  Substance Use Topics  . Smoking status: Current Everyday Smoker -- 0.5 packs/day    Types: Cigarettes  . Smokeless tobacco: Not on file  . Alcohol Use: Yes     weekly      Review of Systems  Constitutional: Negative for activity change and appetite change.  HENT: Negative for neck stiffness.   Eyes: Negative for pain.  Respiratory: Negative for chest tightness and shortness of breath.   Cardiovascular: Negative for chest pain and leg swelling.  Gastrointestinal: Positive for nausea, vomiting and diarrhea. Negative for abdominal pain.  Genitourinary: Negative for flank pain.  Musculoskeletal: Negative for back pain.  Skin: Negative for rash.  Neurological: Negative for weakness, numbness and headaches.  Psychiatric/Behavioral: Negative for behavioral problems.    Allergies  Review of patient's allergies indicates no known allergies.  Home Medications   Current Outpatient Rx  Name Route Sig Dispense Refill  . ATRIPLA 600-200-300 MG PO TABS  TAKE 1 TABLET DAILY ON AN EMPTY STOMACH (MUST KEEP APPOINTMENTS) 3 tablet 2  . LOPERAMIDE HCL 2 MG PO CAPS Oral Take 1 capsule (2 mg total) by mouth 4 (four) times daily as needed for diarrhea or loose stools. 12 capsule 0  . ONDANSETRON 8 MG PO TBDP Oral Take 1 tablet  (8 mg total) by mouth every 8 (eight) hours as needed for nausea. 20 tablet 0    BP 127/77  Pulse 82  Temp(Src) 97.6 F (36.4 C) (Oral)  Resp 16  Wt 130 lb (58.968 kg)  SpO2 100%  Physical Exam  Nursing note and vitals reviewed. Constitutional: He is oriented to person, place, and time. He appears well-developed and well-nourished.  HENT:  Head: Normocephalic and atraumatic.  Eyes: EOM are normal. Pupils are equal, round, and reactive to light.  Cardiovascular: Regular rhythm and normal heart sounds.   No murmur heard. Pulmonary/Chest: Effort normal and breath sounds normal.  Abdominal: Soft. Bowel sounds are normal. He exhibits no distension and no mass. There is no tenderness. There is no rebound and no guarding.  Musculoskeletal: Normal range of motion. He exhibits no edema.  Neurological: He is alert and oriented to person, place, and time. No cranial nerve deficit.  Skin: Skin is warm and dry.  Psychiatric: He has a normal mood and affect.    ED Course  Procedures (including critical care time)  Labs Reviewed - No data to display No results found.   1. Nausea vomiting and diarrhea       MDM  Nausea vomiting diarrhea. Tolerating orals and feels better after IV fluids here. He is HIV positive, has good CD4 count. He'll be discharged home with nausea medicines and Imodium. He'll followup as needed        Juliet Rude. Rubin Payor, MD 08/02/11 [phone]

## 2011-08-01 NOTE — ED Notes (Signed)
Pt states he woke up with an upset stomach yesterday morning and about an hour after that started having diarrhea  Pt states everytime he ate yesterday he could not hold it down  Pt states he only has vomited x 2 but has had several episodes of diarrhea

## 2011-08-02 MED ORDER — ONDANSETRON 8 MG PO TBDP: 8.0000 mg | ORAL_TABLET | Freq: Three times a day (TID) | ORAL | Status: AC | PRN

## 2011-08-02 MED ORDER — LOPERAMIDE HCL 2 MG PO CAPS: 2.0000 mg | ORAL_CAPSULE | Freq: Four times a day (QID) | ORAL | Status: AC | PRN

## 2011-08-02 NOTE — ED Notes (Signed)
Pt tolerating PO fluids

## 2011-08-03 ENCOUNTER — Ambulatory Visit (INDEPENDENT_AMBULATORY_CARE_PROVIDER_SITE_OTHER): Payer: BC Managed Care – PPO | Admitting: *Deleted

## 2011-08-03 VITALS — BP 130/77 | HR 82 | Temp 97.6°F | Wt 126.5 lb

## 2011-08-03 DIAGNOSIS — B20 Human immunodeficiency virus [HIV] disease: Secondary | ICD-10-CM

## 2011-08-03 NOTE — Progress Notes (Signed)
Patient here for week 368 ALLRT study visit. He denies any problems, but does say he has not restarted his meds. He could not afford the copays, so I gave him a copay assistance card for his atripla. He has not seen a physician in quite awhile, so an appt was made for him to see Dr. Luciana Axe in a few weeks. At that time he can get a new scrip written and sent off to his mail order pharmacy. We did not do the HIV VL or CD4 at this visit, since it was not required on study. He will return though on March 5 th for the next study visit and all the annual labs will be covered at that visit.

## 2011-08-04 LAB — COMPREHENSIVE METABOLIC PANEL
Albumin: 3.9 g/dL (ref 3.5–5.2)
BUN: 13 mg/dL (ref 6–23)
CO2: 28 mEq/L (ref 19–32)
Calcium: 9.1 mg/dL (ref 8.4–10.5)
Glucose, Bld: 72 mg/dL (ref 70–99)
Potassium: 4.1 mEq/L (ref 3.5–5.3)
Sodium: 140 mEq/L (ref 135–145)
Total Protein: 6.5 g/dL (ref 6.0–8.3)

## 2011-08-04 LAB — CBC WITH DIFFERENTIAL/PLATELET
Basophils Relative: 1 % (ref 0–1)
HCT: 37 % — ABNORMAL LOW (ref 39.0–52.0)
Hemoglobin: 11.9 g/dL — ABNORMAL LOW (ref 13.0–17.0)
Lymphs Abs: 1.7 10*3/uL (ref 0.7–4.0)
MCH: 22.9 pg — ABNORMAL LOW (ref 26.0–34.0)
MCHC: 32.2 g/dL (ref 30.0–36.0)
Monocytes Absolute: 0.7 10*3/uL (ref 0.1–1.0)
Monocytes Relative: 13 % — ABNORMAL HIGH (ref 3–12)
Neutro Abs: 2.6 10*3/uL (ref 1.7–7.7)
Neutrophils Relative %: 50 % (ref 43–77)
RBC: 5.2 MIL/uL (ref 4.22–5.81)

## 2011-08-04 LAB — LIPID PANEL
Cholesterol: 110 mg/dL (ref 0–200)
HDL: 28 mg/dL — ABNORMAL LOW (ref >39)
Triglycerides: 50 mg/dL (ref <150)

## 2011-08-16 ENCOUNTER — Ambulatory Visit (INDEPENDENT_AMBULATORY_CARE_PROVIDER_SITE_OTHER): Payer: BC Managed Care – PPO | Admitting: Internal Medicine

## 2011-08-16 ENCOUNTER — Encounter: Payer: Self-pay | Admitting: Internal Medicine

## 2011-08-16 VITALS — BP 134/90 | HR 90 | Temp 97.6°F | Wt 125.0 lb

## 2011-08-16 DIAGNOSIS — B20 Human immunodeficiency virus [HIV] disease: Secondary | ICD-10-CM

## 2011-08-16 MED ORDER — EFAVIRENZ-EMTRICITAB-TENOFOVIR 600-200-300 MG PO TABS: 1.0000 | ORAL_TABLET | Freq: Every day | ORAL | Status: DC

## 2011-08-16 NOTE — Assessment & Plan Note (Signed)
See hpi

## 2011-08-16 NOTE — Progress Notes (Signed)
Patient ID: Todd Fry, male   DOB: 01/17/1976, 36 y.o.   MRN: [id number]  HPI: 36 yo with 042 and has been on Atripla for about 3 years under a study protocol here to establish care out of the study.  He unfortunately has been out of meds for 3 months because he could not afford the copay, unaware of copay cards.  He otherwise has not had any problems, and prior to stopping had rarely missed any doses.  He today has no complaints.    ROS 12 pt ROS negative excepat as per the HPI.    Gen - AAO x 3 HEENT - no cervical lad, anicteric, no oral lesions CV - RRR without m/r/g Lungs - CTA B Abd - soft, ntnd, +BS Skin - no rashes  A/P - 36 yo with HIV.  1) 042 - he was counselled on continuation of therapy.  He will restart his Atripla and will have his labs rechecked at his March 5 th study visit.

## 2011-08-16 NOTE — Patient Instructions (Signed)
Call [facility] with the copay info

## 2011-09-15 ENCOUNTER — Encounter: Payer: Self-pay | Admitting: *Deleted

## 2011-09-15 ENCOUNTER — Ambulatory Visit (INDEPENDENT_AMBULATORY_CARE_PROVIDER_SITE_OTHER): Payer: BC Managed Care – PPO | Admitting: *Deleted

## 2011-09-15 VITALS — BP 134/82 | HR 99 | Temp 98.2°F | Resp 16 | Ht 66.5 in | Wt 126.3 lb

## 2011-09-15 DIAGNOSIS — B20 Human immunodeficiency virus [HIV] disease: Secondary | ICD-10-CM

## 2011-09-15 LAB — COMPREHENSIVE METABOLIC PANEL
ALT: 17 U/L (ref 0–53)
BUN: 17 mg/dL (ref 6–23)
CO2: 27 mEq/L (ref 19–32)
Calcium: 9.4 mg/dL (ref 8.4–10.5)
Chloride: 105 mEq/L (ref 96–112)
Creat: 0.75 mg/dL (ref 0.50–1.35)
Glucose, Bld: 86 mg/dL (ref 70–99)
Total Bilirubin: 0.3 mg/dL (ref 0.3–1.2)

## 2011-09-15 LAB — PROTEIN, URINE, RANDOM: Total Protein, Urine: 13 mg/dL

## 2011-09-15 LAB — HEPATITIS C ANTIBODY: HCV Ab: NEGATIVE

## 2011-09-15 LAB — LIPID PANEL
Cholesterol: 162 mg/dL (ref 0–200)
Total CHOL/HDL Ratio: 4.1 Ratio
Triglycerides: 135 mg/dL (ref <150)

## 2011-09-15 NOTE — Progress Notes (Signed)
Patient here for week 384 ALLRT study visit. He denies any current complaints. He is waiting for his atripla from his pharmacy to be sent to him, so he has not restarted it yet. He was informed that the study is closing this Fall and he will have 1 more visit before then. He is not eligible for any other studies currently.

## 2011-09-16 LAB — HEPATITIS A ANTIBODY, TOTAL: Hep A Total Ab: NEGATIVE

## 2011-09-21 ENCOUNTER — Telehealth: Payer: Self-pay | Admitting: Licensed Clinical Social Worker

## 2011-09-21 ENCOUNTER — Ambulatory Visit (INDEPENDENT_AMBULATORY_CARE_PROVIDER_SITE_OTHER): Payer: BC Managed Care – PPO | Admitting: Internal Medicine

## 2011-09-21 DIAGNOSIS — B20 Human immunodeficiency virus [HIV] disease: Secondary | ICD-10-CM

## 2011-09-21 NOTE — Telephone Encounter (Signed)
Patient missed appointment and the person that answered said that I had the wrong number. I verified the number.

## 2011-09-21 NOTE — Telephone Encounter (Signed)
Tamika, Can we refer to bridge counseling? Thanks Asher Muir

## 2011-09-25 NOTE — Progress Notes (Signed)
Patient ID: Todd Fry, male   DOB: 1975/10/27, 36 y.o.   MRN: [id number] No SHOW

## 2011-09-29 ENCOUNTER — Encounter (HOSPITAL_COMMUNITY): Payer: Self-pay

## 2011-09-29 ENCOUNTER — Emergency Department (HOSPITAL_COMMUNITY)
Admission: EM | Admit: 2011-09-29 | Discharge: 2011-09-29 | Disposition: A | Discharge status: Home Health | Payer: BC Managed Care – PPO | Source: Home / Self Care | Attending: Emergency Medicine | Admitting: Emergency Medicine

## 2011-09-29 DIAGNOSIS — J029 Acute pharyngitis, unspecified: Secondary | ICD-10-CM

## 2011-09-29 LAB — POCT RAPID STREP A: Streptococcus, Group A Screen (Direct): NEGATIVE

## 2011-09-29 MED ORDER — ACETAMINOPHEN-CODEINE #3 300-30 MG PO TABS: 1.0000 | ORAL_TABLET | Freq: Four times a day (QID) | ORAL | Status: AC | PRN

## 2011-09-29 NOTE — ED Provider Notes (Signed)
History     CSN: [id number]  Arrival date & time 09/29/11  1236   First MD Initiated Contact with Patient 09/29/11 1246      Chief Complaint  Patient presents with  . Sore Throat    (Consider location/radiation/quality/duration/timing/severity/associated sxs/prior treatment) HPI Comments: Patient presents urgent care complaining of a sore throat for 2 days denies any fevers upper congestion cough shortness of breath have been trying with we'll send this with no relief discomfort when he swallows  Patient is a 36 y.o. male presenting with pharyngitis. The history is provided by the patient.  Sore Throat This is a new problem. The current episode started 2 days ago. The problem occurs constantly. The problem has been gradually worsening. Pertinent negatives include no chest pain and no shortness of breath. The symptoms are aggravated by swallowing. The symptoms are relieved by nothing.    Past Medical History  Diagnosis Date  . HIV infection     Past Surgical History  Procedure Date  . Heart surgery as an infant      No family history on file.  History  Substance Use Topics  . Smoking status: Current Everyday Smoker -- 0.5 packs/day    Types: Cigarettes  . Smokeless tobacco: Not on file  . Alcohol Use: Yes     weekly      Review of Systems  Constitutional: Negative for fever, chills and appetite change.  HENT: Positive for sore throat. Negative for congestion, rhinorrhea and sinus pressure.   Respiratory: Negative for cough and shortness of breath.   Cardiovascular: Negative for chest pain.  Gastrointestinal: Negative for abdominal distention.  Skin: Negative for rash.    Allergies  Review of patient's allergies indicates no known allergies.  Home Medications   Current Outpatient Rx  Name Route Sig Dispense Refill  . EFAVIRENZ-EMTRICITAB-TENOFOVIR 600-200-300 MG PO TABS Oral Take 1 tablet by mouth at bedtime. 30 tablet 11    BP 122/83  Pulse 84   Temp(Src) 98.7 F (37.1 C) (Oral)  Resp 12  SpO2 100%  Physical Exam  Nursing note and vitals reviewed. Constitutional: He appears well-developed and well-nourished.  HENT:  Head: Normocephalic.  Mouth/Throat: Uvula is midline. No uvula swelling. Posterior oropharyngeal erythema present. No oropharyngeal exudate or tonsillar abscesses.  Eyes: Conjunctivae are normal.  Neck: Neck supple.  Pulmonary/Chest: Effort normal and breath sounds normal.  Skin: No rash noted.    ED Course  Procedures (including critical care time)   Labs Reviewed  POCT RAPID STREP A ([facility] ONLY)   No results found.   No diagnosis found.    MDM  Pharyngitis        Jimmie Molly, MD 09/29/11 1322

## 2011-09-29 NOTE — Discharge Instructions (Signed)
Your strep test was negative. Her current pharyngitis with sore throat is probably viral in origin take this medicine for discomfort and pain. Continue with salt water gargles    Pharyngitis, Viral and Bacterial Pharyngitis is soreness (inflammation) or infection of the pharynx. It is also called a sore throat. CAUSES  Most sore throats are caused by viruses and are part of a cold. However, some sore throats are caused by strep and other bacteria. Sore throats can also be caused by post nasal drip from draining sinuses, allergies and sometimes from sleeping with an open mouth. Infectious sore throats can be spread from person to person by coughing, sneezing and sharing cups or eating utensils. TREATMENT  Sore throats that are viral usually last 3-4 days. Viral illness will get better without medications (antibiotics). Strep throat and other bacterial infections will usually begin to get better about 24-48 hours after you begin to take antibiotics. HOME CARE INSTRUCTIONS   If the caregiver feels there is a bacterial infection or if there is a positive strep test, they will prescribe an antibiotic. The full course of antibiotics must be taken. If the full course of antibiotic is not taken, you or your child may become ill again. If you or your child has strep throat and do not finish all of the medication, serious heart or kidney diseases may develop.   Drink enough water and fluids to keep your urine clear or pale yellow.   Only take over-the-counter or prescription medicines for pain, discomfort or fever as directed by your caregiver.   Get lots of rest.   Gargle with salt water ( tsp. of salt in a glass of water) as often as every 1-2 hours as you need for comfort.   Hard candies may soothe the throat if individual is not at risk for choking. Throat sprays or lozenges may also be used.  SEEK MEDICAL CARE IF:   Large, tender lumps in the neck develop.   A rash develops.   Green,  yellow-brown or bloody sputum is coughed up.   Your baby is older than 3 months with a rectal temperature of 100.5 F (38.1 C) or higher for more than 1 day.  SEEK IMMEDIATE MEDICAL CARE IF:   A stiff neck develops.   You or your child are drooling or unable to swallow liquids.   You or your child are vomiting, unable to keep medications or liquids down.   You or your child has severe pain, unrelieved with recommended medications.   You or your child are having difficulty breathing (not due to stuffy nose).   You or your child are unable to fully open your mouth.   You or your child develop redness, swelling, or severe pain anywhere on the neck.   You have a fever.   Your baby is older than 3 months with a rectal temperature of 102 F (38.9 C) or higher.   Your baby is 29 months old or younger with a rectal temperature of 100.4 F (38 C) or higher.  MAKE SURE YOU:   Understand these instructions.   Will watch your condition.   Will get help right away if you are not doing well or get worse.  Document Released: 06/12/2005 Document Revised: 06/01/2011 Document Reviewed: 09/09/2007 [facility] Patient Information 2012 [place], [place].

## 2011-09-29 NOTE — ED Notes (Signed)
Pt c/o sore throat onset 2 days ago.  Pt states he has been using lozenges with no relief.

## 2011-10-19 ENCOUNTER — Encounter: Payer: Self-pay | Admitting: Internal Medicine

## 2011-10-19 LAB — CD4/CD8 (T-HELPER/T-SUPPRESSOR CELL): CD4%: 24.7

## 2011-10-25 ENCOUNTER — Ambulatory Visit: Payer: Self-pay | Admitting: Internal Medicine

## 2011-10-26 ENCOUNTER — Ambulatory Visit (INDEPENDENT_AMBULATORY_CARE_PROVIDER_SITE_OTHER): Payer: BC Managed Care – PPO | Admitting: Internal Medicine

## 2011-10-26 ENCOUNTER — Encounter: Payer: Self-pay | Admitting: Internal Medicine

## 2011-10-26 VITALS — BP 145/83 | HR 98 | Temp 97.9°F | Ht 66.0 in | Wt 123.0 lb

## 2011-10-26 DIAGNOSIS — B20 Human immunodeficiency virus [HIV] disease: Secondary | ICD-10-CM

## 2011-10-26 DIAGNOSIS — Z23 Encounter for immunization: Secondary | ICD-10-CM

## 2011-10-26 NOTE — Progress Notes (Signed)
Addended by: Wendall Mola A on: 10/26/2011 09:46 AM   Modules accepted: Orders

## 2011-10-26 NOTE — Progress Notes (Signed)
  Subjective:    Patient ID: Todd Fry, male    DOB: [birth year], 36 y.o.   MRN: [id number]  HPI Comes in for followup of his HIV. He was previously on a study and has been taking Atripla for 3 years with essentially no missed doses during that time, however unfortunately he was out of medicine at the end of the study and has been off her about 6 months. He restarted his medicines about one month ago and has had no missed doses. He continues to have excellent compliance. He has had no labs since starting medicine with his last labs in March that show a CD4 count of 680 baseline viral load.   Review of Systems  Constitutional: Negative.   HENT: Negative for sore throat and trouble swallowing.   Respiratory: Negative.   Cardiovascular: Negative.   Gastrointestinal: Negative for nausea, abdominal pain and diarrhea.  Musculoskeletal: Negative.   Skin: Negative.   Neurological: Negative for dizziness, weakness and headaches.  Psychiatric/Behavioral: Negative.        Objective:   Physical Exam  Vitals reviewed. Constitutional: He appears well-developed and well-nourished. No distress.  HENT:  Mouth/Throat: Oropharynx is clear and moist. No oropharyngeal exudate.  Cardiovascular: Normal rate, regular rhythm and normal heart sounds.  Exam reveals no gallop and no friction rub.   No murmur heard. Pulmonary/Chest: Effort normal and breath sounds normal. No respiratory distress. He has no wheezes. He has no rales.  Abdominal: Soft. Bowel sounds are normal. He exhibits no distension. There is no tenderness. There is no rebound.  Lymphadenopathy:    He has no cervical adenopathy.          Assessment & Plan:

## 2011-10-26 NOTE — Assessment & Plan Note (Signed)
He will restart his Atripla. I discussed with him the possibility of resistance when stopping and then restarting medications. I will check labs today to assure that it is improved. I will have him return in 2 weeks to discuss the labs. He also is due for several vaccines today and will get a PPD next visit.

## 2011-10-27 LAB — T-HELPER CELL (CD4) - (RCID CLINIC ONLY): CD4 % Helper T Cell: 27 % — ABNORMAL LOW (ref 33–55)

## 2011-10-30 LAB — HIV-1 RNA ULTRAQUANT REFLEX TO GENTYP+
HIV 1 RNA Quant: 141 copies/mL — ABNORMAL HIGH (ref <20)
HIV-1 RNA Quant, Log: 2.15 {Log} — ABNORMAL HIGH (ref <1.30)

## 2011-11-09 ENCOUNTER — Ambulatory Visit (INDEPENDENT_AMBULATORY_CARE_PROVIDER_SITE_OTHER): Payer: BC Managed Care – PPO | Admitting: Internal Medicine

## 2011-11-09 ENCOUNTER — Encounter: Payer: Self-pay | Admitting: Internal Medicine

## 2011-11-09 VITALS — BP 145/84 | HR 91 | Temp 97.4°F | Wt 121.0 lb

## 2011-11-09 DIAGNOSIS — B20 Human immunodeficiency virus [HIV] disease: Secondary | ICD-10-CM

## 2011-11-09 MED ORDER — EFAVIRENZ-EMTRICITAB-TENOFOVIR 600-200-300 MG PO TABS: 1.0000 | ORAL_TABLET | Freq: Every day | ORAL | Status: DC

## 2011-11-09 NOTE — Progress Notes (Signed)
  Subjective:    Patient ID: Todd Fry, male    DOB: 12-05-1975, 36 y.o.   MRN: [id number]  HPI Is here for followup on his medications. He has previously been on Atripla and had restarted it recently and is here for followup of his labs. He does have good compliance with the medications but I was worried about resistance having stopped it. He has not had any missed doses and is continuing to tolerate it well.   Review of Systems  Constitutional: Negative.   HENT: Negative for sore throat and trouble swallowing.   Cardiovascular: Negative.   Gastrointestinal: Negative for nausea, abdominal pain and diarrhea.  Musculoskeletal: Negative for myalgias, joint swelling and arthralgias.  Skin: Negative.   Neurological: Negative.   Psychiatric/Behavioral: Negative.        Objective:   Physical Exam  Constitutional: He appears well-developed and well-nourished. No distress.  Cardiovascular: Normal rate, regular rhythm and normal heart sounds.  Exam reveals no gallop and no friction rub.   No murmur heard. Pulmonary/Chest: Effort normal and breath sounds normal. No respiratory distress. He has no wheezes. He has no rales.          Assessment & Plan:

## 2011-11-09 NOTE — Assessment & Plan Note (Signed)
His labs are reassuring for compliance and his viral load has decreased to nearly undetectable. I will go repeat it in about 3 months and followup with him at that time. Also give him a PPD at that time.

## 2011-11-10 ENCOUNTER — Encounter: Payer: Self-pay | Admitting: Internal Medicine

## 2012-01-02 ENCOUNTER — Other Ambulatory Visit: Payer: Self-pay | Admitting: *Deleted

## 2012-01-02 NOTE — Telephone Encounter (Signed)
Alinda Money called and wanted to see if he can have his prescription changed to every 3 months instead of monthly.

## 2012-01-16 ENCOUNTER — Ambulatory Visit (INDEPENDENT_AMBULATORY_CARE_PROVIDER_SITE_OTHER): Payer: BC Managed Care – PPO | Admitting: *Deleted

## 2012-01-16 ENCOUNTER — Encounter: Payer: Self-pay | Admitting: *Deleted

## 2012-01-16 VITALS — BP 120/78 | HR 76 | Temp 97.5°F | Wt 119.8 lb

## 2012-01-16 DIAGNOSIS — B20 Human immunodeficiency virus [HIV] disease: Secondary | ICD-10-CM

## 2012-01-16 LAB — COMPREHENSIVE METABOLIC PANEL
ALT: 25 U/L (ref 0–53)
Alkaline Phosphatase: 61 U/L (ref 39–117)
CO2: 25 mEq/L (ref 19–32)
Potassium: 3.9 mEq/L (ref 3.5–5.3)
Sodium: 138 mEq/L (ref 135–145)
Total Bilirubin: 0.3 mg/dL (ref 0.3–1.2)
Total Protein: 6.8 g/dL (ref 6.0–8.3)

## 2012-01-16 LAB — CD4/CD8 (T-HELPER/T-SUPPRESSOR CELL): CD4: 532

## 2012-01-16 LAB — LIPID PANEL
LDL Cholesterol: 108 mg/dL — ABNORMAL HIGH (ref 0–99)
VLDL: 11 mg/dL (ref 0–40)

## 2012-01-16 NOTE — Progress Notes (Signed)
Pt here for A5001, week 400 Final Study Visit. Assessment has not changed since last study visit. Pt adhering to Atripla regimen. Pt is not eligible for any upcoming studies but will keep him in mind if one becomes available. Vital signs are stable. Fasting labs were drawn. Pt to see Dr. Luciana Axe in August for follow up. Pt received $20.00 gift card for study visit. Tacey Heap RN

## 2012-01-17 LAB — CREATININE, URINE, RANDOM: Creatinine, Urine: 230.3 mg/dL

## 2012-01-30 ENCOUNTER — Other Ambulatory Visit: Payer: BC Managed Care – PPO

## 2012-02-06 ENCOUNTER — Encounter: Payer: Self-pay | Admitting: Internal Medicine

## 2012-02-13 ENCOUNTER — Ambulatory Visit: Payer: BC Managed Care – PPO | Admitting: Internal Medicine

## 2012-02-29 ENCOUNTER — Ambulatory Visit: Payer: BC Managed Care – PPO | Admitting: Internal Medicine

## 2012-04-18 ENCOUNTER — Ambulatory Visit: Payer: BC Managed Care – PPO | Admitting: Internal Medicine

## 2012-05-01 ENCOUNTER — Ambulatory Visit: Payer: BC Managed Care – PPO | Admitting: Internal Medicine

## 2012-05-03 ENCOUNTER — Other Ambulatory Visit: Payer: BC Managed Care – PPO

## 2012-05-16 ENCOUNTER — Telehealth: Payer: Self-pay | Admitting: *Deleted

## 2012-05-16 ENCOUNTER — Ambulatory Visit: Payer: BC Managed Care – PPO | Admitting: Internal Medicine

## 2012-05-16 NOTE — Telephone Encounter (Signed)
Patient referred to [facility] for multiple no shows and cancellations. Last lab 10/2011 Wendall Mola CMA

## 2012-07-15 ENCOUNTER — Other Ambulatory Visit: Payer: BC Managed Care – PPO

## 2012-07-30 ENCOUNTER — Ambulatory Visit: Payer: BC Managed Care – PPO | Admitting: Internal Medicine

## 2012-08-01 ENCOUNTER — Other Ambulatory Visit: Payer: BC Managed Care – PPO

## 2012-08-01 DIAGNOSIS — B20 Human immunodeficiency virus [HIV] disease: Secondary | ICD-10-CM

## 2012-08-01 LAB — CBC WITH DIFFERENTIAL/PLATELET
Basophils Relative: 0 % (ref 0–1)
Eosinophils Absolute: 0.1 10*3/uL (ref 0.0–0.7)
Eosinophils Relative: 2 % (ref 0–5)
HCT: 37.2 % — ABNORMAL LOW (ref 39.0–52.0)
Hemoglobin: 12.4 g/dL — ABNORMAL LOW (ref 13.0–17.0)
MCH: 23.2 pg — ABNORMAL LOW (ref 26.0–34.0)
MCHC: 33.3 g/dL (ref 30.0–36.0)
MCV: 69.7 fL — ABNORMAL LOW (ref 78.0–100.0)
Monocytes Absolute: 0.5 10*3/uL (ref 0.1–1.0)
Monocytes Relative: 9 % (ref 3–12)

## 2012-08-01 LAB — COMPREHENSIVE METABOLIC PANEL
Albumin: 4.1 g/dL (ref 3.5–5.2)
Alkaline Phosphatase: 54 U/L (ref 39–117)
BUN: 19 mg/dL (ref 6–23)
Glucose, Bld: 110 mg/dL — ABNORMAL HIGH (ref 70–99)
Potassium: 4.2 mEq/L (ref 3.5–5.3)
Total Bilirubin: 0.3 mg/dL (ref 0.3–1.2)

## 2012-08-02 LAB — T-HELPER CELL (CD4) - (RCID CLINIC ONLY): CD4 T Cell Abs: 330 uL — ABNORMAL LOW (ref 400–2700)

## 2012-08-02 LAB — HIV-1 RNA QUANT-NO REFLEX-BLD: HIV-1 RNA Quant, Log: 4.35 {Log} — ABNORMAL HIGH (ref <1.30)

## 2012-08-06 ENCOUNTER — Telehealth: Payer: Self-pay | Admitting: *Deleted

## 2012-08-06 NOTE — Telephone Encounter (Signed)
Message copied by Macy Mis on Tue Aug 06, 2012  8:54 AM ------      Message from: Gardiner Barefoot      Created: Mon Aug 05, 2012 11:40 AM       It looks like he stopped taking his medicine.  If he hasn't, he should stop it now, it is not working.  Thanks ------

## 2012-08-06 NOTE — Telephone Encounter (Signed)
Left patient message to return my call. If he is taking his medication he needs to stop and schedule a follow up to discuss a new regimen.  Wendall Mola

## 2012-08-08 ENCOUNTER — Ambulatory Visit: Payer: BC Managed Care – PPO | Admitting: Internal Medicine

## 2012-08-22 ENCOUNTER — Ambulatory Visit: Payer: BC Managed Care – PPO | Admitting: Internal Medicine

## 2012-08-22 ENCOUNTER — Telehealth: Payer: Self-pay | Admitting: *Deleted

## 2012-08-22 NOTE — Telephone Encounter (Signed)
Patient called front desk to cancel appointment within minutes of scheduled time.  Given new appointment, but told this will be the last one before Walk In clinics.  Appointment left on the schedule for a no-show. Andree Coss, RN

## 2012-09-19 ENCOUNTER — Ambulatory Visit (INDEPENDENT_AMBULATORY_CARE_PROVIDER_SITE_OTHER): Payer: BC Managed Care – PPO | Admitting: Internal Medicine

## 2012-09-19 ENCOUNTER — Encounter: Payer: Self-pay | Admitting: Internal Medicine

## 2012-09-19 ENCOUNTER — Other Ambulatory Visit: Payer: Self-pay | Admitting: *Deleted

## 2012-09-19 VITALS — Temp 98.1°F | Ht 66.0 in | Wt 128.0 lb

## 2012-09-19 DIAGNOSIS — B20 Human immunodeficiency virus [HIV] disease: Secondary | ICD-10-CM

## 2012-09-19 DIAGNOSIS — Z23 Encounter for immunization: Secondary | ICD-10-CM

## 2012-09-19 MED ORDER — DARUNAVIR ETHANOLATE 800 MG PO TABS: 800.0000 mg | ORAL_TABLET | Freq: Every day | ORAL | Status: DC

## 2012-09-19 MED ORDER — RITONAVIR 100 MG PO TABS: 100.0000 mg | ORAL_TABLET | Freq: Every day | ORAL | Status: DC

## 2012-09-19 MED ORDER — EMTRICITABINE-TENOFOVIR DF 200-300 MG PO TABS: 1.0000 | ORAL_TABLET | Freq: Every day | ORAL | Status: DC

## 2012-09-19 NOTE — Assessment & Plan Note (Signed)
Unfortunately he has again stopped his regimen. I have discussed with him at length the need to take medication and continue on interrupted care. He voices understanding. I have discussed with him taking a new regimen and will start him on Prezista with Norvir and Truvada. He was seen by the pharmacist as well and counseled on the medications. He does have access to medications and will go to pharmacy now for filling them. We'll then recheck labs in one month. I will add a genotype in case there is some resistance to the tenofovir or emtricitabine.   More than 60 minutes was spent with him including 35 minutes of face-to-face counseling in 15 minutes of coordination of care.

## 2012-09-19 NOTE — Progress Notes (Signed)
  Subjective:    Patient ID: Todd Fry, male    DOB: 1975-12-02, 37 y.o.   MRN: [id number]  HPI He comes in for followup. She had been on Atripla after stopping and being out of care for some time. He however ran out of the medication and did not get any refills he states 2 to the co-pay. He did not contact us and is unsure if he has access with his insurance or drug assistance program. His CD4 has decreased his viral load is active. He has no complaints   Review of Systems  Constitutional: Negative for fever, fatigue and unexpected weight change.  HENT: Negative for sore throat and trouble swallowing.   Respiratory: Negative for cough and shortness of breath.   Cardiovascular: Negative for leg swelling.  Gastrointestinal: Negative for nausea, abdominal pain and diarrhea.  Musculoskeletal: Negative for myalgias, joint swelling and arthralgias.  Skin: Negative for rash.  Neurological: Negative for dizziness and headaches.       Objective:   Physical Exam  Constitutional: He appears well-developed and well-nourished. No distress.  HENT:  Mouth/Throat: Oropharynx is clear and moist. No oropharyngeal exudate.  Cardiovascular: Normal rate, regular rhythm and normal heart sounds.  Exam reveals no gallop and no friction rub.   No murmur heard. Pulmonary/Chest: Effort normal and breath sounds normal. No respiratory distress. He has no wheezes. He has no rales.  Abdominal: Soft. Bowel sounds are normal. He exhibits no distension. There is no tenderness. There is no rebound.          Assessment & Plan:

## 2012-11-05 ENCOUNTER — Other Ambulatory Visit: Payer: BC Managed Care – PPO

## 2012-11-08 ENCOUNTER — Other Ambulatory Visit: Payer: BC Managed Care – PPO

## 2012-11-08 DIAGNOSIS — B20 Human immunodeficiency virus [HIV] disease: Secondary | ICD-10-CM

## 2012-11-08 LAB — CBC WITH DIFFERENTIAL/PLATELET
Basophils Absolute: 0 10*3/uL (ref 0.0–0.1)
Basophils Relative: 0 % (ref 0–1)
Eosinophils Absolute: 0.1 10*3/uL (ref 0.0–0.7)
HCT: 41.4 % (ref 39.0–52.0)
MCHC: 33.1 g/dL (ref 30.0–36.0)
MCV: 70.5 fL — ABNORMAL LOW (ref 78.0–100.0)
Monocytes Absolute: 0.4 10*3/uL (ref 0.1–1.0)
Monocytes Relative: 8 % (ref 3–12)
Neutro Abs: 2.9 10*3/uL (ref 1.7–7.7)
Neutrophils Relative %: 59 % (ref 43–77)
RBC: 5.87 MIL/uL — ABNORMAL HIGH (ref 4.22–5.81)
RDW: 16.6 % — ABNORMAL HIGH (ref 11.5–15.5)
WBC: 4.9 10*3/uL (ref 4.0–10.5)

## 2012-11-08 LAB — COMPREHENSIVE METABOLIC PANEL
AST: 21 U/L (ref 0–37)
Alkaline Phosphatase: 62 U/L (ref 39–117)
BUN: 12 mg/dL (ref 6–23)
Glucose, Bld: 100 mg/dL — ABNORMAL HIGH (ref 70–99)
Sodium: 136 mEq/L (ref 135–145)
Total Bilirubin: 0.3 mg/dL (ref 0.3–1.2)

## 2012-11-11 LAB — HIV-1 RNA ULTRAQUANT REFLEX TO GENTYP+
HIV 1 RNA Quant: 90 copies/mL — ABNORMAL HIGH (ref <20)
HIV-1 RNA Quant, Log: 1.95 {Log} — ABNORMAL HIGH (ref <1.30)

## 2012-11-19 ENCOUNTER — Ambulatory Visit (INDEPENDENT_AMBULATORY_CARE_PROVIDER_SITE_OTHER): Payer: BC Managed Care – PPO | Admitting: Internal Medicine

## 2012-11-19 ENCOUNTER — Encounter: Payer: Self-pay | Admitting: Internal Medicine

## 2012-11-19 VITALS — BP 122/85 | HR 80 | Temp 97.9°F | Ht 66.0 in | Wt 120.0 lb

## 2012-11-19 DIAGNOSIS — B20 Human immunodeficiency virus [HIV] disease: Secondary | ICD-10-CM

## 2012-11-19 NOTE — Assessment & Plan Note (Signed)
He is doing well and his new regimen and no signs of resistance with a nearly suppressed virus. He'll return in 3 months for routine followup and to keep an eye on his viral load on the new regimen and continued emphasis on compliance.

## 2012-11-19 NOTE — Progress Notes (Signed)
  Subjective:    Patient ID: Todd Fry, male    DOB: April 21, 1976, 37 y.o.   MRN: [id number]  HPI He comes in for follow up of his HIV. He started his new regimen of Prezista, Norvir and Truvada.  He denies any missed doses and has good tolerance of the medications. He has a small amount of loose stool more than usual but no frank diarrhea and no increased bowel movements. No rash, no weight loss or other concerns.   Review of Systems  Constitutional: Negative for fatigue and unexpected weight change.  HENT: Negative for sore throat and trouble swallowing.   Gastrointestinal: Negative for abdominal pain and diarrhea.  Skin: Negative for rash.  Neurological: Negative for dizziness and headaches.       Objective:   Physical Exam  Constitutional: He appears well-developed and well-nourished. No distress.  Cardiovascular: Normal rate, regular rhythm and normal heart sounds.  Exam reveals no gallop and no friction rub.   No murmur heard. Pulmonary/Chest: Effort normal and breath sounds normal. No respiratory distress. He has no wheezes.  Lymphadenopathy:    He has no cervical adenopathy.          Assessment & Plan:

## 2013-02-06 ENCOUNTER — Other Ambulatory Visit: Payer: BC Managed Care – PPO

## 2013-02-10 ENCOUNTER — Other Ambulatory Visit (INDEPENDENT_AMBULATORY_CARE_PROVIDER_SITE_OTHER): Payer: BC Managed Care – PPO

## 2013-02-10 DIAGNOSIS — B20 Human immunodeficiency virus [HIV] disease: Secondary | ICD-10-CM

## 2013-02-10 LAB — COMPLETE METABOLIC PANEL WITH GFR
ALT: 20 U/L (ref 0–53)
AST: 24 U/L (ref 0–37)
BUN: 17 mg/dL (ref 6–23)
CO2: 26 mEq/L (ref 19–32)
Creat: 0.91 mg/dL (ref 0.50–1.35)
GFR, Est African American: >89 mL/min
Total Bilirubin: 0.4 mg/dL (ref 0.3–1.2)

## 2013-02-10 LAB — CBC WITH DIFFERENTIAL/PLATELET
Basophils Absolute: 0 10*3/uL (ref 0.0–0.1)
Eosinophils Absolute: 0.1 10*3/uL (ref 0.0–0.7)
Eosinophils Relative: 2 % (ref 0–5)
MCH: 24 pg — ABNORMAL LOW (ref 26.0–34.0)
MCV: 71.5 fL — ABNORMAL LOW (ref 78.0–100.0)
Monocytes Absolute: 0.5 10*3/uL (ref 0.1–1.0)
Platelets: 323 10*3/uL (ref 150–400)
RDW: 16.5 % — ABNORMAL HIGH (ref 11.5–15.5)

## 2013-02-11 LAB — HIV-1 RNA QUANT-NO REFLEX-BLD
HIV 1 RNA Quant: <20 copies/mL (ref <20)
HIV-1 RNA Quant, Log: <1.3 {Log} (ref <1.30)

## 2013-02-11 LAB — T-HELPER CELL (CD4) - (RCID CLINIC ONLY): CD4 % Helper T Cell: 25 % — ABNORMAL LOW (ref 33–55)

## 2013-02-20 ENCOUNTER — Ambulatory Visit: Payer: BC Managed Care – PPO | Admitting: Internal Medicine

## 2013-02-20 ENCOUNTER — Telehealth: Payer: Self-pay | Admitting: *Deleted

## 2013-02-20 NOTE — Telephone Encounter (Signed)
Pt has had numerous no-shows. Pt should be walk-in only.  Will check to see if he has a case manager to assist with office visits. Andree Coss, RN

## 2013-02-25 ENCOUNTER — Ambulatory Visit: Payer: BC Managed Care – PPO | Admitting: Internal Medicine

## 2014-01-25 ENCOUNTER — Encounter (HOSPITAL_COMMUNITY): Payer: Self-pay | Admitting: Emergency Medicine

## 2014-01-25 ENCOUNTER — Emergency Department (HOSPITAL_COMMUNITY)
Admission: EM | Admit: 2014-01-25 | Discharge: 2014-01-25 | Disposition: A | Discharge status: Home / Self Care | Payer: BC Managed Care – PPO | Attending: Emergency Medicine | Admitting: Emergency Medicine

## 2014-01-25 DIAGNOSIS — F17200 Nicotine dependence, unspecified, uncomplicated: Secondary | ICD-10-CM | POA: Insufficient documentation

## 2014-01-25 DIAGNOSIS — R21 Rash and other nonspecific skin eruption: Secondary | ICD-10-CM | POA: Insufficient documentation

## 2014-01-25 DIAGNOSIS — Z21 Asymptomatic human immunodeficiency virus [HIV] infection status: Secondary | ICD-10-CM | POA: Insufficient documentation

## 2014-01-25 DIAGNOSIS — Z79899 Other long term (current) drug therapy: Secondary | ICD-10-CM | POA: Insufficient documentation

## 2014-01-25 MED ORDER — TRIAMCINOLONE ACETONIDE 0.1 % EX CREA: 1.0000 "application " | TOPICAL_CREAM | Freq: Two times a day (BID) | CUTANEOUS | Status: DC

## 2014-01-25 NOTE — Discharge Instructions (Signed)

## 2014-01-25 NOTE — ED Provider Notes (Signed)
CSN: [id number]     Arrival date & time 01/25/14  1409 History  This chart was scribed for non-physician practitioner, Elpidio AnisShari Finneas Mathe, PA-C working with Suzi RootsKevin E Steinl, MD by Greggory StallionKayla Andersen, ED scribe. This patient was seen in room WTR7/WTR7 and the patient's care was started at 3:15 PM.   Chief Complaint  Patient presents with  . Rash   The history is provided by the patient. No language interpreter was used.   HPI Comments: Todd Fry is a 38 y.o. male with history of eczema and HIV who presents to the Emergency Department complaining of gradual onset, diffuse, itchy rash that started 4 days ago. States the rash is worse on his arms. Pt was not exposed to any chemicals or working out in the yard but states he works for [employer]. No one else at home has a similar rash. Denies history of similar rash.   Past Medical History  Diagnosis Date  . HIV infection    Past Surgical History  Procedure Laterality Date  . Heart surgery as an infant      History reviewed. No pertinent family history. History  Substance Use Topics  . Smoking status: Current Every Day Smoker -- 0.50 packs/day    Types: Cigarettes  . Smokeless tobacco: Never Used  . Alcohol Use: Yes     Comment: weekly    Review of Systems  Skin: Positive for rash.  All other systems reviewed and are negative.  Allergies  Review of patient's allergies indicates no known allergies.  Home Medications   Prior to Admission medications   Medication Sig Start Date End Date Taking? Authorizing Provider  Darunavir Ethanolate (PREZISTA) 800 MG tablet Take 1 tablet (800 mg total) by mouth daily. 09/19/12   Gardiner Barefootobert W Comer, MD  emtricitabine-tenofovir (TRUVADA) 200-300 MG per tablet Take 1 tablet by mouth daily. 09/19/12   Gardiner Barefootobert W Comer, MD  ritonavir (NORVIR) 100 MG TABS Take 1 tablet (100 mg total) by mouth daily. 09/19/12   Gardiner Barefootobert W Comer, MD   BP 139/75  Pulse 90  Temp(Src) 98.4 F (36.9 C) (Oral)  Resp 16  SpO2  100%  Physical Exam  Nursing note and vitals reviewed. Constitutional: He is oriented to person, place, and time. He appears well-developed and well-nourished. No distress.  HENT:  Head: Normocephalic and atraumatic.  Eyes: Conjunctivae and EOM are normal.  Neck: Neck supple. No tracheal deviation present.  Cardiovascular: Normal rate.   Pulmonary/Chest: Effort normal. No respiratory distress.  Musculoskeletal: Normal range of motion.  Neurological: He is alert and oriented to person, place, and time.  Skin: Skin is warm and dry.  Maculopapular rash to volar upper extremities without pustules. Minimal erythema. No swelling. No excoriation. No involvement of hands or web spaces.   Psychiatric: He has a normal mood and affect. His behavior is normal.    ED Course  Procedures (including critical care time)  DIAGNOSTIC STUDIES: Oxygen Saturation is 100% on RA, normal by my interpretation.    COORDINATION OF CARE: 3:17 PM-Discussed treatment plan which includes topical steroid with pt at bedside and pt agreed to plan.   Labs Review Labs Reviewed - No data to display  Imaging Review No results found.   EKG Interpretation None      MDM   Final diagnoses:  None    1. Nonspecific rash  No evidence of infection. Rash does not appear allergic or parasitic. Topical steroids and PCP follow up if it continues.   I personally  performed the services described in this documentation, which was scribed in my presence. The recorded information has been reviewed and is accurate.  Arnoldo Hooker, PA-C 01/25/14 1539

## 2014-01-25 NOTE — ED Notes (Signed)
Pt c/o rash x4 days. No hx of eczema.  Rash reported on arms and little all over. Denies pains.  Explains rash as itchy.

## 2014-01-26 NOTE — ED Provider Notes (Signed)
Medical screening examination/treatment/procedure(s) were performed by non-physician practitioner and as supervising physician I was immediately available for consultation/collaboration.     Gianna Calef E Takya Vandivier, MD 01/26/14 0910 

## 2016-01-25 ENCOUNTER — Ambulatory Visit: Payer: Self-pay | Admitting: Internal Medicine

## 2016-02-23 ENCOUNTER — Ambulatory Visit: Payer: Self-pay | Admitting: Internal Medicine

## 2017-02-07 ENCOUNTER — Encounter (HOSPITAL_COMMUNITY): Payer: Self-pay

## 2017-02-07 ENCOUNTER — Emergency Department (HOSPITAL_COMMUNITY): Payer: Worker's Compensation

## 2017-02-07 ENCOUNTER — Emergency Department (HOSPITAL_COMMUNITY)
Admission: EM | Admit: 2017-02-07 | Discharge: 2017-02-08 | Disposition: A | Discharge status: Home / Self Care | Payer: Worker's Compensation | Attending: Emergency Medicine | Admitting: Emergency Medicine

## 2017-02-07 DIAGNOSIS — Y999 Unspecified external cause status: Secondary | ICD-10-CM | POA: Insufficient documentation

## 2017-02-07 DIAGNOSIS — S99922A Unspecified injury of left foot, initial encounter: Secondary | ICD-10-CM | POA: Insufficient documentation

## 2017-02-07 DIAGNOSIS — W208XXA Other cause of strike by thrown, projected or falling object, initial encounter: Secondary | ICD-10-CM | POA: Insufficient documentation

## 2017-02-07 DIAGNOSIS — Z79899 Other long term (current) drug therapy: Secondary | ICD-10-CM | POA: Insufficient documentation

## 2017-02-07 DIAGNOSIS — Y939 Activity, unspecified: Secondary | ICD-10-CM | POA: Diagnosis not present

## 2017-02-07 DIAGNOSIS — Y929 Unspecified place or not applicable: Secondary | ICD-10-CM | POA: Diagnosis not present

## 2017-02-07 DIAGNOSIS — F17210 Nicotine dependence, cigarettes, uncomplicated: Secondary | ICD-10-CM | POA: Insufficient documentation

## 2017-02-07 NOTE — ED Provider Notes (Signed)
[facility]-EMERGENCY DEPT Provider Note   CSN: [id number] Arrival date & time: 02/07/17  2225     History   Chief Complaint Chief Complaint  Patient presents with  . Foot Pain    HPI Todd Fry is a 41 y.o. male.  Patient presents to the ED with a chief complaint of left foot pain.  He states that he dropped a pallet on his foot today.  He complains of pain isolated to the midfoot.  He has not taken anything for his symptoms.  He denies any other injuries.  Denies any numbness, weakness, or tingling.   The history is provided by the patient. No language interpreter was used.    Past Medical History:  Diagnosis Date  . HIV infection [facility](HCC)     Patient Active Problem List   Diagnosis Date Noted  . HIV DISEASE 10/11/2006    Past Surgical History:  Procedure Laterality Date  . heart surgery as an infant          Home Medications    Prior to Admission medications   Medication Sig Start Date End Date Taking? Authorizing Provider  Darunavir Ethanolate (PREZISTA) 800 MG tablet Take 1 tablet (800 mg total) by mouth daily. 09/19/12   Gardiner Barefootomer, Delray Reza W, MD  emtricitabine-tenofovir (TRUVADA) 200-300 MG per tablet Take 1 tablet by mouth daily. 09/19/12   Gardiner Barefootomer, Marlane Hirschmann W, MD  ritonavir (NORVIR) 100 MG TABS Take 1 tablet (100 mg total) by mouth daily. 09/19/12   Gardiner Barefootomer, Asenath Balash W, MD  triamcinolone cream (KENALOG) 0.1 % Apply 1 application topically 2 (two) times daily. 01/25/14   Elpidio AnisUpstill, Shari, PA-C    Family History History reviewed. No pertinent family history.  Social History Social History  Substance Use Topics  . Smoking status: Current Every Day Smoker    Packs/day: 0.50    Types: Cigarettes  . Smokeless tobacco: Never Used  . Alcohol use Yes     Comment: weekly     Allergies   Patient has no known allergies.   Review of Systems Review of Systems  All other systems reviewed and are negative.    Physical Exam Updated Vital Signs BP 136/77 (BP Location:  Left Arm)   Pulse 97   Temp 98.9 F (37.2 C) (Oral)   Resp 18   SpO2 94%   Physical Exam Nursing note and vitals reviewed.  Constitutional: Pt appears well-developed and well-nourished. No distress.  HENT:  Head: Normocephalic and atraumatic.  Eyes: Conjunctivae are normal.  Neck: Normal range of motion.  Cardiovascular: Normal rate, regular rhythm. Intact distal pulses.   Capillary refill < 3 sec.  Pulmonary/Chest: Effort normal and breath sounds normal.  Musculoskeletal:  LEFT foot Pt exhibits TTP about the midfoot.   ROM: 5/5  Strength: 5/5  Neurological: Pt  is alert. Coordination normal.  Sensation: 5/5 Skin: Skin is warm and dry. Pt is not diaphoretic.  Mild abrasion to midfoot Psychiatric: Pt has a normal mood and affect.     ED Treatments / Results  Labs (all labs ordered are listed, but only abnormal results are displayed) Labs Reviewed - No data to display  EKG  EKG Interpretation None       Radiology Dg Foot Complete Left  Result Date: 02/07/2017 CLINICAL DATA:  Injury to the left foot with pain EXAM: LEFT FOOT - COMPLETE 3+ VIEW COMPARISON:  None. FINDINGS: There is no evidence of fracture or dislocation. There is no evidence of arthropathy or other focal bone abnormality. Soft  tissues are unremarkable. IMPRESSION: Negative. Electronically Signed   By: Jasmine Pang M.D.   On: 02/07/2017 23:40    Procedures Procedures (including critical care time)  Medications Ordered in ED Medications - No data to display   Initial Impression / Assessment and Plan / ED Course  I have reviewed the triage vital signs and the nursing notes.  Pertinent labs & imaging results that were available during my care of the patient were reviewed by me and considered in my medical decision making (see chart for details).     Patient X-Ray negative for obvious fracture or dislocation.  Pt advised to follow up with PCP. Patient will be discharged home & is agreeable with  above plan. Returns precautions discussed. Pt appears safe for discharge.   Final Clinical Impressions(s) / ED Diagnoses   Final diagnoses:  Injury of left foot, initial encounter    New Prescriptions New Prescriptions   No medications on file     Roxy Horseman, Cordelia Poche 02/07/17 2350    Derwood Kaplan, MD 02/08/17 [phone]

## 2017-02-07 NOTE — ED Triage Notes (Signed)
Pt dropped a pallet on his left foot today, bleeding controlled but top of foot is swollen

## 2018-11-11 ENCOUNTER — Emergency Department (HOSPITAL_COMMUNITY): Payer: BLUE CROSS/BLUE SHIELD

## 2018-11-11 ENCOUNTER — Emergency Department (HOSPITAL_COMMUNITY)
Admission: EM | Admit: 2018-11-11 | Discharge: 2018-11-11 | Disposition: A | Discharge status: Home / Self Care | Payer: BLUE CROSS/BLUE SHIELD | Attending: Emergency Medicine | Admitting: Emergency Medicine

## 2018-11-11 ENCOUNTER — Other Ambulatory Visit: Payer: Self-pay

## 2018-11-11 ENCOUNTER — Encounter (HOSPITAL_COMMUNITY): Payer: Self-pay

## 2018-11-11 DIAGNOSIS — Y998 Other external cause status: Secondary | ICD-10-CM | POA: Insufficient documentation

## 2018-11-11 DIAGNOSIS — B20 Human immunodeficiency virus [HIV] disease: Secondary | ICD-10-CM | POA: Insufficient documentation

## 2018-11-11 DIAGNOSIS — Y9302 Activity, running: Secondary | ICD-10-CM | POA: Diagnosis not present

## 2018-11-11 DIAGNOSIS — F17210 Nicotine dependence, cigarettes, uncomplicated: Secondary | ICD-10-CM | POA: Diagnosis not present

## 2018-11-11 DIAGNOSIS — S93602A Unspecified sprain of left foot, initial encounter: Secondary | ICD-10-CM | POA: Diagnosis not present

## 2018-11-11 DIAGNOSIS — Y929 Unspecified place or not applicable: Secondary | ICD-10-CM | POA: Diagnosis not present

## 2018-11-11 DIAGNOSIS — W228XXA Striking against or struck by other objects, initial encounter: Secondary | ICD-10-CM | POA: Diagnosis not present

## 2018-11-11 DIAGNOSIS — Z79899 Other long term (current) drug therapy: Secondary | ICD-10-CM | POA: Diagnosis not present

## 2018-11-11 DIAGNOSIS — S99922A Unspecified injury of left foot, initial encounter: Secondary | ICD-10-CM | POA: Diagnosis present

## 2018-11-11 NOTE — Discharge Instructions (Addendum)
Use Tylenol and ibuprofen as needed for pain. °Use buddy tape to help with pain control. °Wear the postop shoe to help support your foot. °Return to the emergency room with any new, worsening, concerning symptoms. °

## 2018-11-11 NOTE — ED Provider Notes (Signed)
[facility] Provider Note   CSN: [id number] Arrival date & time: 11/11/18  2004    History   Chief Complaint Chief Complaint  Patient presents with  . Foot Injury    HPI Todd Fry is a 43 y.o. male presenting for evaluation of left foot pain.  Patient states yesterday he was running in flip-flops when he tripped, stubbing his left toes.  He had mild discomfort yesterday, but has had increased pain today.  He has not taken anything for pain including Tylenol or ibuprofen.  He denies injury elsewhere.  He denies numbness or tingling.  He reports redness and swelling at the base of his second and third toe of his left foot.  Pain is constant, worse with palpation and movement.  Nothing makes it better.      HPI  Past Medical History:  Diagnosis Date  . HIV infection [facility](HCC)     Patient Active Problem List   Diagnosis Date Noted  . HIV DISEASE 10/11/2006    Past Surgical History:  Procedure Laterality Date  . heart surgery as an infant           Home Medications    Prior to Admission medications   Medication Sig Start Date End Date Taking? Authorizing Provider  Darunavir Ethanolate (PREZISTA) 800 MG tablet Take 1 tablet (800 mg total) by mouth daily. 09/19/12   Gardiner Barefootomer, Robert W, MD  emtricitabine-tenofovir (TRUVADA) 200-300 MG per tablet Take 1 tablet by mouth daily. 09/19/12   Gardiner Barefootomer, Robert W, MD  ritonavir (NORVIR) 100 MG TABS Take 1 tablet (100 mg total) by mouth daily. 09/19/12   Gardiner Barefootomer, Robert W, MD  triamcinolone cream (KENALOG) 0.1 % Apply 1 application topically 2 (two) times daily. 01/25/14   Elpidio AnisUpstill, Shari, PA-C    Family History History reviewed. No pertinent family history.  Social History Social History   Tobacco Use  . Smoking status: Current Every Day Smoker    Packs/day: 0.50    Types: Cigarettes  . Smokeless tobacco: Never Used  Substance Use Topics  . Alcohol use: Yes    Comment: weekly  . Drug use:  No     Allergies   Patient has no known allergies.   Review of Systems Review of Systems  Musculoskeletal: Positive for arthralgias and joint swelling.  Hematological: Does not bruise/bleed easily.     Physical Exam Updated Vital Signs BP (!) 147/98 (BP Location: Right Arm)   Pulse 99   Temp 99.1 F (37.3 C) (Oral)   Resp 16   Ht 5\' 6"  (1.676 m)   Wt 56.7 kg   SpO2 100%   BMI 20.18 kg/m   Physical Exam Vitals signs and nursing note reviewed.  Constitutional:      General: He is not in acute distress.    Appearance: He is well-developed.  HENT:     Head: Normocephalic and atraumatic.  Neck:     Musculoskeletal: Normal range of motion.  Pulmonary:     Effort: Pulmonary effort is normal.  Abdominal:     General: There is no distension.  Musculoskeletal: Normal range of motion.        General: Swelling and tenderness present.     Comments: Tenderness of the proximal phalanx of the second and third toes of the left foot.  Mild erythema and swelling.  Good distal cap refill.  Sensation intact.  No tenderness palpation elsewhere in the foot.  Good pedal pulses.  Patient is ambulatory.  Full active range of motion of the toes with pain.  Skin:    General: Skin is warm.     Capillary Refill: Capillary refill takes less than 2 seconds.     Findings: No rash.  Neurological:     Mental Status: He is alert and oriented to person, place, and time.      ED Treatments / Results  Labs (all labs ordered are listed, but only abnormal results are displayed) Labs Reviewed - No data to display  EKG None  Radiology Dg Foot Complete Left  Result Date: 11/11/2018 CLINICAL DATA:  43 year old male with fall yesterday and distal left foot pain. EXAM: LEFT FOOT - COMPLETE 3+ VIEW COMPARISON:  Left foot series 02/07/2017. FINDINGS: Bone mineralization is within normal limits. There is no evidence of acute fracture or dislocation. Questionable healed chronic 4th proximal phalanx  fracture, stable. There is no evidence of arthropathy or other focal bone abnormality. No discrete soft tissue injury. IMPRESSION: Stable since 2018.  No acute osseous abnormality identified. Electronically Signed   By: Odessa Fleming M.D.   On: 11/11/2018 21:08    Procedures Procedures (including critical care time)  Medications Ordered in ED Medications - No data to display   Initial Impression / Assessment and Plan / ED Course  I have reviewed the triage vital signs and the nursing notes.  Pertinent labs & imaging results that were available during my care of the patient were reviewed by me and considered in my medical decision making (see chart for details).        Patient presenting for evaluation of left foot pain.  Physical examination, he is neurovascularly intact.  X-rays viewed interpreted by me, no fracture dislocation.  Pain is reproducible with palpation of the proximal phalanx of the second and third toe.  Likely sprain.  Discussed with patient.  Discussed treatment with buddy tape, postop shoe, ice, Tylenol, ibuprofen.  At this time, patient appears safe for discharge.  Return precautions given.  Patient states he understands and agrees to plan.   Final Clinical Impressions(s) / ED Diagnoses   Final diagnoses:  Sprain of left foot, initial encounter    ED Discharge Orders    None       Alveria Apley, PA-C 11/11/18 2215    Tegeler, Canary Brim, MD 11/12/18 0010

## 2018-11-11 NOTE — ED Notes (Signed)
Patient verbalizes understanding of discharge instructions. Opportunity for questioning and answers were provided. Armband removed by staff, pt discharged from ED ambulatory to home.  

## 2018-11-18 ENCOUNTER — Other Ambulatory Visit: Payer: Self-pay

## 2018-11-18 ENCOUNTER — Emergency Department (HOSPITAL_COMMUNITY)
Admission: EM | Admit: 2018-11-18 | Discharge: 2018-11-18 | Disposition: A | Discharge status: Home / Self Care | Payer: BLUE CROSS/BLUE SHIELD | Attending: Emergency Medicine | Admitting: Emergency Medicine

## 2018-11-18 ENCOUNTER — Emergency Department (HOSPITAL_COMMUNITY): Payer: BLUE CROSS/BLUE SHIELD

## 2018-11-18 DIAGNOSIS — Y929 Unspecified place or not applicable: Secondary | ICD-10-CM | POA: Insufficient documentation

## 2018-11-18 DIAGNOSIS — F17210 Nicotine dependence, cigarettes, uncomplicated: Secondary | ICD-10-CM | POA: Insufficient documentation

## 2018-11-18 DIAGNOSIS — S80812A Abrasion, left lower leg, initial encounter: Secondary | ICD-10-CM | POA: Diagnosis not present

## 2018-11-18 DIAGNOSIS — Z21 Asymptomatic human immunodeficiency virus [HIV] infection status: Secondary | ICD-10-CM | POA: Diagnosis not present

## 2018-11-18 DIAGNOSIS — S0081XA Abrasion of other part of head, initial encounter: Secondary | ICD-10-CM | POA: Diagnosis not present

## 2018-11-18 DIAGNOSIS — R030 Elevated blood-pressure reading, without diagnosis of hypertension: Secondary | ICD-10-CM | POA: Diagnosis not present

## 2018-11-18 DIAGNOSIS — S82832A Other fracture of upper and lower end of left fibula, initial encounter for closed fracture: Secondary | ICD-10-CM

## 2018-11-18 DIAGNOSIS — Y999 Unspecified external cause status: Secondary | ICD-10-CM | POA: Insufficient documentation

## 2018-11-18 DIAGNOSIS — Z79899 Other long term (current) drug therapy: Secondary | ICD-10-CM | POA: Insufficient documentation

## 2018-11-18 DIAGNOSIS — Y9389 Activity, other specified: Secondary | ICD-10-CM | POA: Diagnosis not present

## 2018-11-18 DIAGNOSIS — S8992XA Unspecified injury of left lower leg, initial encounter: Secondary | ICD-10-CM | POA: Diagnosis present

## 2018-11-18 MED ORDER — TRAMADOL HCL 50 MG PO TABS: 50.0000 mg | ORAL_TABLET | Freq: Four times a day (QID) | ORAL | 0 refills | Status: DC | PRN

## 2018-11-18 MED ORDER — ONDANSETRON HCL 4 MG/2ML IJ SOLN
4.0000 mg | Freq: Once | INTRAMUSCULAR | Status: AC
  Administered 2018-11-18: 4 mg via INTRAVENOUS
  Filled 2018-11-18: qty 2

## 2018-11-18 MED ORDER — BACITRACIN ZINC 500 UNIT/GM EX OINT
TOPICAL_OINTMENT | Freq: Once | CUTANEOUS | Status: AC
  Administered 2018-11-18: 1 via TOPICAL
  Filled 2018-11-18: qty 1.8

## 2018-11-18 MED ORDER — TETANUS-DIPHTH-ACELL PERTUSSIS 5-2.5-18.5 LF-MCG/0.5 IM SUSP
0.5000 mL | Freq: Once | INTRAMUSCULAR | Status: AC
  Administered 2018-11-18: 0.5 mL via INTRAMUSCULAR
  Filled 2018-11-18: qty 0.5

## 2018-11-18 MED ORDER — BACITRACIN ZINC 500 UNIT/GM EX OINT: TOPICAL_OINTMENT | Freq: Once | CUTANEOUS | Status: DC

## 2018-11-18 MED ORDER — MORPHINE SULFATE (PF) 4 MG/ML IV SOLN
4.0000 mg | Freq: Once | INTRAVENOUS | Status: AC
  Administered 2018-11-18: 4 mg via INTRAVENOUS
  Filled 2018-11-18: qty 1

## 2018-11-18 NOTE — ED Notes (Signed)
Pt accidentally pulled out IV after administration of zofran.

## 2018-11-18 NOTE — ED Notes (Signed)
Pt returned from xray. [employer] waiting on pt to arrive.

## 2018-11-18 NOTE — ED Notes (Signed)
BP 133/84

## 2018-11-18 NOTE — ED Notes (Signed)
Pt verbalized understanding of discharge paperwork and follow-up care.  °

## 2018-11-18 NOTE — ED Notes (Signed)
Patient transported to X-ray 
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
## 2018-11-18 NOTE — ED Notes (Signed)
Notified Consulting civil engineer of issues with [employer].

## 2018-11-18 NOTE — Discharge Instructions (Addendum)
It was our pleasure to provide your ER care today - we hope that you feel better.  Keep abrasions very clean - wash with soap and water 1-2x/day.  Icepack to sore area.  Use knee immobilizer and crutches. Elevate knee/leg.   Take motrin or aleve as need for pain. You may also take ultram as need for pain - no driving for the next 6 hours, or when taking ultram.   Follow up with orthopedist this coming week for your fibula head fracture - call office tomorrow AM to arrange appointment.   Also follow up closely with primary care doctor this week - your blood pressure is high - have it rechecked then.   Return to ER if worse, new or severe pain, numbness/weakness, other concern.
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
## 2021-06-27 ENCOUNTER — Other Ambulatory Visit: Payer: Self-pay

## 2021-06-27 ENCOUNTER — Encounter (HOSPITAL_COMMUNITY): Payer: Self-pay | Admitting: Emergency Medicine

## 2021-06-27 ENCOUNTER — Emergency Department (HOSPITAL_COMMUNITY)
Admission: EM | Admit: 2021-06-27 | Discharge: 2021-06-28 | Disposition: A | Discharge status: Home / Self Care | Payer: BC Managed Care – PPO | Attending: Emergency Medicine | Admitting: Emergency Medicine

## 2021-06-27 ENCOUNTER — Emergency Department (HOSPITAL_COMMUNITY): Payer: BC Managed Care – PPO

## 2021-06-27 DIAGNOSIS — R000 Tachycardia, unspecified: Secondary | ICD-10-CM | POA: Insufficient documentation

## 2021-06-27 DIAGNOSIS — R066 Hiccough: Secondary | ICD-10-CM | POA: Diagnosis not present

## 2021-06-27 DIAGNOSIS — D649 Anemia, unspecified: Secondary | ICD-10-CM | POA: Diagnosis not present

## 2021-06-27 DIAGNOSIS — B20 Human immunodeficiency virus [HIV] disease: Secondary | ICD-10-CM

## 2021-06-27 DIAGNOSIS — R112 Nausea with vomiting, unspecified: Secondary | ICD-10-CM | POA: Diagnosis not present

## 2021-06-27 DIAGNOSIS — Z21 Asymptomatic human immunodeficiency virus [HIV] infection status: Secondary | ICD-10-CM | POA: Diagnosis not present

## 2021-06-27 DIAGNOSIS — Z91199 Patient's noncompliance with other medical treatment and regimen due to unspecified reason: Secondary | ICD-10-CM | POA: Insufficient documentation

## 2021-06-27 DIAGNOSIS — R1310 Dysphagia, unspecified: Secondary | ICD-10-CM | POA: Diagnosis not present

## 2021-06-27 NOTE — ED Triage Notes (Signed)
Patient reports hiccups for 6 days , denies emesis or fever . Respirations unlabored/no chest pain .

## 2021-06-27 NOTE — ED Provider Notes (Signed)
Emergency Medicine Provider Triage Evaluation Note  Todd Fry , a 46 y.o. male  was evaluated in triage.  Pt complains of hiccups for 6 days.  He states no history of similar.  No fevers.  No cough or shortness of breath. His hiccups briefly stopped after 4 days prior to restarting.  He denies GI upset or alcohol use.   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  He is not entirely sure what this was for stating he thinks he may have had a leaky valve that had to be repaired but is unsure of details beyond that.  He does report he has discomfort in his chest and abdomen but attributes that to his frequent hiccups.   Review of Systems  Positive: Hiccups Negative: fevers  Physical Exam  BP 131/90    Pulse (!) 105    Temp 98 F (36.7 C)    Resp 12    Ht 5\' 6"  (1.676 m)    Wt 65 kg    SpO2 100%    BMI 23.13 kg/m  Gen:   Awake, no distress   Resp:  Normal effort  MSK:   Moves extremities without difficulty  Other:  Frequent hiccups while I am in the room.  Regular rate and rhythm.  Abdomen is soft nontender nondistended  Medical Decision Making  Medically screening exam initiated at 11:30 PM.  Appropriate orders placed.  Todd Fry was informed that the remainder of the evaluation will be completed by another provider, this initial triage assessment does not replace that evaluation, and the importance of remaining in the ED until their evaluation is complete.  Patient presents today with 6 days of hiccups.  He does have a history of open heart surgery as an infant per his report however is unable to tell me what this was from.    Will obtain labs to check for electrolyte derangements, EKG and troponin for cardiac irritation especially in the setting of having unknown open heart surgery as an infant and his tachycardia (HR 105).  Additionally will check lipase and abdominal labs.  Note: Portions of this report may have been transcribed using voice recognition  software. Every effort was made to ensure accuracy; however, inadvertent computerized transcription errors may be present     Todd Fry 06/27/21 2335    2336, MD 06/28/21 2302

## 2021-06-28 LAB — CBC WITH DIFFERENTIAL/PLATELET
Abs Immature Granulocytes: 0 10*3/uL (ref 0.00–0.07)
Basophils Absolute: 0 10*3/uL (ref 0.0–0.1)
Basophils Relative: 0 %
Eosinophils Absolute: 0.1 10*3/uL (ref 0.0–0.5)
Eosinophils Relative: 1 %
HCT: 32.9 % — ABNORMAL LOW (ref 39.0–52.0)
Hemoglobin: 10.5 g/dL — ABNORMAL LOW (ref 13.0–17.0)
Lymphocytes Relative: 35 %
Lymphs Abs: 2.4 10*3/uL (ref 0.7–4.0)
MCH: 24.1 pg — ABNORMAL LOW (ref 26.0–34.0)
MCHC: 31.9 g/dL (ref 30.0–36.0)
MCV: 75.5 fL — ABNORMAL LOW (ref 80.0–100.0)
Monocytes Absolute: 0.7 10*3/uL (ref 0.1–1.0)
Monocytes Relative: 10 %
Neutro Abs: 3.7 10*3/uL (ref 1.7–7.7)
Neutrophils Relative %: 54 %
Platelets: 310 10*3/uL (ref 150–400)
RBC: 4.36 MIL/uL (ref 4.22–5.81)
RDW: 15.5 % (ref 11.5–15.5)
WBC: 6.9 10*3/uL (ref 4.0–10.5)
nRBC: 0 % (ref 0.0–0.2)
nRBC: 0 /100 WBC

## 2021-06-28 LAB — COMPREHENSIVE METABOLIC PANEL
ALT: 114 U/L — ABNORMAL HIGH (ref 0–44)
AST: 57 U/L — ABNORMAL HIGH (ref 15–41)
Albumin: 3.5 g/dL (ref 3.5–5.0)
Alkaline Phosphatase: 78 U/L (ref 38–126)
Anion gap: 7 (ref 5–15)
BUN: 18 mg/dL (ref 6–20)
CO2: 22 mmol/L (ref 22–32)
Calcium: 9.3 mg/dL (ref 8.9–10.3)
Chloride: 107 mmol/L (ref 98–111)
Creatinine, Ser: 0.74 mg/dL (ref 0.61–1.24)
GFR, Estimated: >60 mL/min (ref >60)
Glucose, Bld: 91 mg/dL (ref 70–99)
Potassium: 3.9 mmol/L (ref 3.5–5.1)
Sodium: 136 mmol/L (ref 135–145)
Total Bilirubin: 0.7 mg/dL (ref 0.3–1.2)
Total Protein: 8.3 g/dL — ABNORMAL HIGH (ref 6.5–8.1)

## 2021-06-28 LAB — TROPONIN I (HIGH SENSITIVITY)
Troponin I (High Sensitivity): 3 ng/L (ref <18)
Troponin I (High Sensitivity): 3 ng/L (ref <18)

## 2021-06-28 LAB — LIPASE, BLOOD: Lipase: 45 U/L (ref 11–51)

## 2021-06-28 MED ORDER — NYSTATIN 100000 UNIT/ML MT SUSP: 500000.0000 [IU] | Freq: Four times a day (QID) | OROMUCOSAL | 0 refills | Status: DC

## 2021-06-28 NOTE — ED Notes (Signed)
Patient discharge instructions and prescription reviewed with the patient. The patient verbalized understanding of instructions. Patient discharged.

## 2021-06-28 NOTE — ED Provider Notes (Signed)
[facility] EMERGENCY DEPARTMENT Provider Note   CSN: [id number] Arrival date & time: 06/27/21  2307     History  Chief Complaint  Patient presents with   Hiccups    Todd Fry is a 46 y.o. male.  HPI 46 year old male history of HIV, off medications for several years presents today complaining of hiccups for several days.  He denies any chest pain, fever, chills, nausea, vomiting, diarrhea.  He denies any history of oncologic problems.    Home Medications Prior to Admission medications   Medication Sig Start Date End Date Taking? Authorizing Provider  nystatin (MYCOSTATIN) 100000 UNIT/ML suspension Take 5 mLs (500,000 Units total) by mouth 4 (four) times daily. 06/28/21  Yes Pattricia Boss, MD  Darunavir Ethanolate (PREZISTA) 800 MG tablet Take 1 tablet (800 mg total) by mouth daily. 09/19/12   Thayer Headings, MD  emtricitabine-tenofovir (TRUVADA) 200-300 MG per tablet Take 1 tablet by mouth daily. 09/19/12   Thayer Headings, MD  ritonavir (NORVIR) 100 MG TABS Take 1 tablet (100 mg total) by mouth daily. 09/19/12   Comer, Okey Regal, MD  traMADol (ULTRAM) 50 MG tablet Take 1 tablet (50 mg total) by mouth every 6 (six) hours as needed. 11/18/18   Lajean Saver, MD  triamcinolone cream (KENALOG) 0.1 % Apply 1 application topically 2 (two) times daily. 01/25/14   Charlann Lange, PA-C      Allergies    Patient has no known allergies.    Review of Systems   Review of Systems  All other systems reviewed and are negative.  Physical Exam Updated Vital Signs BP (!) 139/94    Pulse (!) 101    Temp 98.2 F (36.8 C)    Resp 20    Ht 1.676 m ($Remove'5\' 6"'[id number]$ )    Wt 65 kg    SpO2 100%    BMI 23.13 kg/m  Physical Exam Vitals and nursing note reviewed.  Constitutional:      Appearance: Normal appearance.  HENT:     Head: Normocephalic.     Right Ear: External ear normal.     Left Ear: External ear normal.     Nose: Nose normal.     Mouth/Throat:     Pharynx: Oropharynx is  clear.  Eyes:     Extraocular Movements: Extraocular movements intact.     Pupils: Pupils are equal, round, and reactive to light.  Cardiovascular:     Rate and Rhythm: Normal rate.     Pulses: Normal pulses.     Heart sounds: Normal heart sounds.  Pulmonary:     Effort: Pulmonary effort is normal.  Abdominal:     General: Bowel sounds are normal.     Palpations: Abdomen is soft.  Musculoskeletal:        General: Normal range of motion.     Cervical back: Normal range of motion and neck supple. No tenderness.  Lymphadenopathy:     Cervical: No cervical adenopathy.  Skin:    General: Skin is warm.     Capillary Refill: Capillary refill takes less than 2 seconds.  Neurological:     General: No focal deficit present.     Mental Status: He is alert.  Psychiatric:        Mood and Affect: Mood normal.    ED Results / Procedures / Treatments   Labs (all labs ordered are listed, but only abnormal results are displayed) Labs Reviewed  CBC WITH DIFFERENTIAL/PLATELET - Abnormal; Notable for the  following components:      Result Value   Hemoglobin 10.5 (*)    HCT 32.9 (*)    MCV 75.5 (*)    MCH 24.1 (*)    All other components within normal limits  COMPREHENSIVE METABOLIC PANEL - Abnormal; Notable for the following components:   Total Protein 8.3 (*)    AST 57 (*)    ALT 114 (*)    All other components within normal limits  LIPASE, BLOOD  TROPONIN I (HIGH SENSITIVITY)  TROPONIN I (HIGH SENSITIVITY)    EKG EKG Interpretation  Date/Time:  Monday June 27 2021 23:36:57 EST Ventricular Rate:  107 PR Interval:  138 QRS Duration: 86 QT Interval:  332 QTC Calculation: 443 R Axis:   92 Text Interpretation: Sinus tachycardia Rightward axis Borderline ECG No previous ECGs available Confirmed by Margarita Grizzle [phone]) on 06/28/2021 8:45:48 AM  Radiology DG Chest 2 View  Result Date: 06/27/2021 CLINICAL DATA:  Hiccups x6 days. EXAM: CHEST - 2 VIEW COMPARISON:  None. FINDINGS:  The heart size and mediastinal contours are within normal limits. Small radiopaque surgical clips are seen just below the aortic arch. Both lungs are clear. The visualized skeletal structures are unremarkable. IMPRESSION: No active cardiopulmonary disease. Electronically Signed   By: Aram Candela M.D.   On: 06/27/2021 23:53    Procedures Medications Ordered in ED Medications - No data to display  ED Course/ Medical Decision Making/ A&P Clinical Course as of 06/28/21 0906  Tue Jun 28, 2021  7308 Chest 3 personally reviewed and interpreted with no acute disease Radiologist interpretation reviewed and concur [DR]  0844 C-Met reviewed and significant for slightly elevated AST and ALT of 57 and 114.  Ruben normal, alk phos normal [DR]  0844 Prior AST and ALT normal [DR]  0844 CBC reviewed and significant for hemoglobin of 10.5 which is anemic [DR]  0845 Patient has had some prior anemia with last hemoglobin 12.8. [DR]    Clinical Course User Index [DR] Margarita Grizzle, MD                           Medical Decision Making Patient with known history of HIV and noncompliant complaint presents today complaining of hiccups.  He is not having any associated pain, difficulty swallowing, nausea, vomiting.  He has mild tachycardia at 100 with normal blood pressure no fever and normal respiratory rate.  His oropharynx does not show any obvious signs of thrush.  Patient could have functional esophageal contractions versus infection or other esophageal abnormalities that are contributing to his problem.  Given the fact that he has a known history of HIV and has been off his medications, will treat with nystatin.  His CBC is also consistent with the mild anemia that could be secondary to not taking his HIV medications.  Discussed the necessity of his follow-up with infectious disease and he voices understanding.  He appears to be clinically stable for discharge  Amount and/or Complexity of Data  Reviewed Labs: ordered. Decision-making details documented in ED Course.  Risk Prescription drug management. Decision regarding hospitalization.  Final Clinical Impression(s) / ED Diagnoses Final diagnoses:  Intractable hiccups  HIV infection, unspecified symptom status (HCC)  Noncompliance    Rx / DC Orders ED Discharge Orders          Ordered    nystatin (MYCOSTATIN) 100000 UNIT/ML suspension  4 times daily        06/28/21 0905  Pattricia Boss, MD 06/28/21 [phone]

## 2021-06-28 NOTE — Discharge Instructions (Addendum)
These use the nystatin as prescribed for possible esophagitis and this should help with your hiccups Please call the infectious disease clinic and get in to restart your medications and have follow-up on your abnormal labs noted today.

## 2021-07-05 ENCOUNTER — Other Ambulatory Visit (HOSPITAL_COMMUNITY)
Admission: RE | Admit: 2021-07-05 | Discharge: 2021-07-05 | Disposition: A | Discharge status: Home / Self Care | Payer: BC Managed Care – PPO | Source: Ambulatory Visit | Attending: Internal Medicine | Admitting: Internal Medicine

## 2021-07-05 ENCOUNTER — Encounter: Payer: Self-pay | Admitting: Internal Medicine

## 2021-07-05 ENCOUNTER — Other Ambulatory Visit (HOSPITAL_COMMUNITY): Payer: Self-pay

## 2021-07-05 ENCOUNTER — Other Ambulatory Visit: Payer: Self-pay

## 2021-07-05 ENCOUNTER — Ambulatory Visit: Payer: BC Managed Care – PPO | Admitting: Internal Medicine

## 2021-07-05 VITALS — BP 136/90 | HR 102 | Temp 98.0°F | Wt 132.0 lb

## 2021-07-05 DIAGNOSIS — Z23 Encounter for immunization: Secondary | ICD-10-CM

## 2021-07-05 DIAGNOSIS — Z113 Encounter for screening for infections with a predominantly sexual mode of transmission: Secondary | ICD-10-CM

## 2021-07-05 DIAGNOSIS — B20 Human immunodeficiency virus [HIV] disease: Secondary | ICD-10-CM | POA: Insufficient documentation

## 2021-07-05 DIAGNOSIS — Z79899 Other long term (current) drug therapy: Secondary | ICD-10-CM

## 2021-07-05 MED ORDER — BICTEGRAVIR-EMTRICITAB-TENOFOV 50-200-25 MG PO TABS: 1.0000 | ORAL_TABLET | Freq: Every day | ORAL | 11 refills | Status: DC

## 2021-07-05 NOTE — Progress Notes (Signed)
Patient ID: Todd Fry, male   DOB: Sep 26, 1975, 46 y.o.   MRN: [id number]   Patient ID: Todd Fry, male    DOB: 08-12-1975, 46 y.o.   MRN: [id number]  Reason for visit: to establish care as a return patient with HIV  HPI:   Patient was first diagnosed over 10 years ago and was last in care in 2014.  He has not been in care anywhere else. He has had no issues since then other than recent issues with intractable hiccups.  He has no complaints today and interested in getting back in care.    Past Medical History:  Diagnosis Date   HIV infection (HCC)     Prior to Admission medications   Medication Sig Start Date End Date Taking? Authorizing Provider  nystatin (MYCOSTATIN) 100000 UNIT/ML suspension Take 5 mLs (500,000 Units total) by mouth 4 (four) times daily. 06/28/21  Yes Margarita Grizzle, MD  Darunavir Ethanolate (PREZISTA) 800 MG tablet Take 1 tablet (800 mg total) by mouth daily. Patient not taking: Reported on 07/05/2021 09/19/12   Gardiner Barefoot, MD  emtricitabine-tenofovir (TRUVADA) 200-300 MG per tablet Take 1 tablet by mouth daily. Patient not taking: Reported on 07/05/2021 09/19/12   Gardiner Barefoot, MD  ritonavir (NORVIR) 100 MG TABS Take 1 tablet (100 mg total) by mouth daily. Patient not taking: Reported on 07/05/2021 09/19/12   Gardiner Barefoot, MD  traMADol (ULTRAM) 50 MG tablet Take 1 tablet (50 mg total) by mouth every 6 (six) hours as needed. Patient not taking: Reported on 07/05/2021 11/18/18   Cathren Laine, MD  triamcinolone cream (KENALOG) 0.1 % Apply 1 application topically 2 (two) times daily. Patient not taking: Reported on 07/05/2021 01/25/14   Elpidio Anis, PA-C    No Known Allergies  Social History   Tobacco Use   Smoking status: Every Day    Packs/day: 0.50    Types: Cigarettes   Smokeless tobacco: Never  Substance Use Topics   Alcohol use: Yes    Comment: weekly   Drug use: No   FMH: + cardiac disease  Review of Systems Constitutional: negative  for fatigue, malaise, and anorexia Gastrointestinal: negative for nausea and diarrhea Integument/breast: negative for rash Musculoskeletal: negative for myalgias and arthralgias All other systems reviewed and are negative    CONSTITUTIONAL:in no apparent distress There were no vitals filed for this visit. EYES: anicteric HENT: no thrush CARD:Cor RRR RESP:normal respiratory effort MS:no pedal edema noted SKIN:no rash NEURO: non-focal  Lab Results  Component Value Date   HIV1RNAQUANT <20 02/10/2013   HIV1RNAQUANT 90 (H) 11/08/2012   HIV1RNAQUANT 22,452 (H) 08/01/2012   No components found for: HIV1GENOTYPRPLUS No components found for: THELPERCELL  Assessment: new patient here with established HIV.  Discussed with patient treatment options and side effects, benefits of treatment, long term outcomes.  I discussed the severity of untreated HIV including higher cancer risk, opportunistic infections, renal failure.  Also discussed needing to use condoms, partner disclosure, necessary vaccines, blood monitoring.  All questions answered.    Plan: 1)  start Biktarvy 2) update vaccines - needs prevnar 13.  Will discuss Menveo at next visit  3) labs today - will include hepatitis labs with transaminitis  Follow up in about 4-5 weeks on medication

## 2021-07-05 NOTE — Addendum Note (Signed)
Addended by: Tressa Busman T on: 07/05/2021 11:04 AM   Modules accepted: Orders

## 2021-07-06 LAB — URINE CYTOLOGY ANCILLARY ONLY
Chlamydia: POSITIVE — AB
Comment: NEGATIVE
Comment: NORMAL
Neisseria Gonorrhea: POSITIVE — AB

## 2021-07-06 LAB — T-HELPER CELL (CD4) - (RCID CLINIC ONLY)
CD4 % Helper T Cell: 8 % — ABNORMAL LOW (ref 33–65)
CD4 T Cell Abs: 225 /uL — ABNORMAL LOW (ref 400–1790)

## 2021-07-07 ENCOUNTER — Telehealth: Payer: Self-pay

## 2021-07-07 NOTE — Telephone Encounter (Signed)
-----   Message from Gardiner Barefoot, MD sent at 07/06/2021  4:49 PM EST ----- He is positive for GC and chlamydia and needs to come in for treatment.  Thanks  ceftriaxone and azithromycin.

## 2021-07-07 NOTE — Telephone Encounter (Signed)
Patient returned call, relayed positive gonorrhea and chlamydia results. He accepts appointment for treatment tomorrow with pharmacy.   Advised him to refrain from sex until treatment is completed plus 7 days. Asked him to please notify sexual partners so that they can be tested and treated as well. Patient verbalized understanding and has no further questions.   Sandie Ano, RN

## 2021-07-07 NOTE — Telephone Encounter (Signed)
Called patient to relay results and schedule treatment, no answer. Left HIPAA compliant voicemail requesting callback.   Angeligue Bowne D Hendricks Schwandt, RN  

## 2021-07-07 NOTE — Telephone Encounter (Signed)
Thank you for the heads up as always Megan :)

## 2021-07-08 ENCOUNTER — Ambulatory Visit (INDEPENDENT_AMBULATORY_CARE_PROVIDER_SITE_OTHER): Payer: BC Managed Care – PPO | Admitting: Pharmacist

## 2021-07-08 ENCOUNTER — Other Ambulatory Visit: Payer: Self-pay

## 2021-07-08 DIAGNOSIS — A749 Chlamydial infection, unspecified: Secondary | ICD-10-CM

## 2021-07-08 DIAGNOSIS — A549 Gonococcal infection, unspecified: Secondary | ICD-10-CM | POA: Diagnosis not present

## 2021-07-08 MED ORDER — DOXYCYCLINE HYCLATE 100 MG PO TABS: 100.0000 mg | ORAL_TABLET | Freq: Two times a day (BID) | ORAL | 0 refills | Status: AC

## 2021-07-08 MED ORDER — CEFTRIAXONE SODIUM 500 MG IJ SOLR
500.0000 mg | Freq: Once | INTRAMUSCULAR | Status: AC
  Administered 2021-07-08: 500 mg via INTRAMUSCULAR

## 2021-07-08 NOTE — Progress Notes (Signed)
° °  [facility] Vaccination Visit  HPI: Todd Fry is a 46 y.o. male who presents to the [facility] pharmacy clinic for gonorrhea and chlamydia treatment.   Hepatitis B Lab Results  Component Value Date   HEPBSAB NON-REACTIVE 07/05/2021   Lab Results  Component Value Date   HEPBSAG NON-REACTIVE 07/05/2021    Hepatitis C Lab Results  Component Value Date   HCVAB NEGATIVE 09/15/2011    Hepatitis A Lab Results  Component Value Date   HAV NEG 09/15/2011    Assessment & Plan: Todd Fry presents today for treatment of gonorrhea and chlamydia. He received CTX 500 mg IM and was counseled on doxycycline. He was advised to abstain from sexual activity for 7 days while he is being treated.   - Administered ceftriaxone 500 mg IM x1 - Patient tolerated well - Doxycycline 100 mg PO BID x7 sent to patient's preferred pharmacy  Georgeann Oppenheim, PharmD Pharmacy Resident 07/08/2021, 10:32 AM

## 2021-07-16 LAB — COMPLETE METABOLIC PANEL WITH GFR
AG Ratio: 1 (calc) (ref 1.0–2.5)
ALT: 75 U/L — ABNORMAL HIGH (ref 9–46)
AST: 48 U/L — ABNORMAL HIGH (ref 10–40)
Albumin: 3.9 g/dL (ref 3.6–5.1)
Alkaline phosphatase (APISO): 76 U/L (ref 36–130)
BUN: 12 mg/dL (ref 7–25)
CO2: 31 mmol/L (ref 20–32)
Calcium: 9.4 mg/dL (ref 8.6–10.3)
Chloride: 105 mmol/L (ref 98–110)
Creat: 0.71 mg/dL (ref 0.60–1.29)
Globulin: 4.1 g/dL (calc) — ABNORMAL HIGH (ref 1.9–3.7)
Glucose, Bld: 85 mg/dL (ref 65–99)
Potassium: 4.4 mmol/L (ref 3.5–5.3)
Sodium: 138 mmol/L (ref 135–146)
Total Bilirubin: 0.4 mg/dL (ref 0.2–1.2)
Total Protein: 8 g/dL (ref 6.1–8.1)
eGFR: 115 mL/min/{1.73_m2} (ref >=60)

## 2021-07-16 LAB — HIV-1 RNA ULTRAQUANT REFLEX TO GENTYP+
HIV 1 RNA Quant: 316000 copies/mL — ABNORMAL HIGH
HIV-1 RNA Quant, Log: 5.5 Log copies/mL — ABNORMAL HIGH

## 2021-07-16 LAB — CBC WITH DIFFERENTIAL/PLATELET
Absolute Monocytes: 449 cells/uL (ref 200–950)
Basophils Absolute: 21 cells/uL (ref 0–200)
Basophils Relative: 0.3 %
Eosinophils Absolute: 41 cells/uL (ref 15–500)
Eosinophils Relative: 0.6 %
HCT: 32.9 % — ABNORMAL LOW (ref 38.5–50.0)
Hemoglobin: 10.6 g/dL — ABNORMAL LOW (ref 13.2–17.1)
Lymphs Abs: 3174 cells/uL (ref 850–3900)
MCH: 24.1 pg — ABNORMAL LOW (ref 27.0–33.0)
MCHC: 32.2 g/dL (ref 32.0–36.0)
MCV: 74.9 fL — ABNORMAL LOW (ref 80.0–100.0)
MPV: 10.4 fL (ref 7.5–12.5)
Monocytes Relative: 6.5 %
Neutro Abs: 3215 cells/uL (ref 1500–7800)
Neutrophils Relative %: 46.6 %
Platelets: 325 10*3/uL (ref 140–400)
RBC: 4.39 10*6/uL (ref 4.20–5.80)
RDW: 15.9 % — ABNORMAL HIGH (ref 11.0–15.0)
Total Lymphocyte: 46 %
WBC: 6.9 10*3/uL (ref 3.8–10.8)

## 2021-07-16 LAB — LIPID PANEL
Cholesterol: 169 mg/dL (ref <200)
HDL: 32 mg/dL — ABNORMAL LOW (ref >=40)
LDL Cholesterol (Calc): 115 mg/dL (calc) — ABNORMAL HIGH
Non-HDL Cholesterol (Calc): 137 mg/dL (calc) — ABNORMAL HIGH (ref <130)
Total CHOL/HDL Ratio: 5.3 (calc) — ABNORMAL HIGH (ref <5.0)
Triglycerides: 111 mg/dL (ref <150)

## 2021-07-16 LAB — HEPATITIS B SURFACE ANTIBODY,QUALITATIVE: Hep B S Ab: NONREACTIVE

## 2021-07-16 LAB — HEPATITIS B SURFACE ANTIGEN: Hepatitis B Surface Ag: NONREACTIVE

## 2021-07-16 LAB — HIV-1 GENOTYPE: HIV-1 Genotype: DETECTED — AB

## 2021-07-16 LAB — HEPATITIS C ANTIBODY
Hepatitis C Ab: NONREACTIVE
SIGNAL TO CUT-OFF: 0.25 (ref <1.00)

## 2021-07-16 LAB — RPR: RPR Ser Ql: NONREACTIVE

## 2021-08-10 ENCOUNTER — Other Ambulatory Visit: Payer: Self-pay

## 2021-08-10 ENCOUNTER — Encounter: Payer: Self-pay | Admitting: Internal Medicine

## 2021-08-10 ENCOUNTER — Ambulatory Visit (INDEPENDENT_AMBULATORY_CARE_PROVIDER_SITE_OTHER): Payer: BC Managed Care – PPO | Admitting: Internal Medicine

## 2021-08-10 VITALS — BP 137/83 | HR 111 | Resp 16 | Ht 66.0 in | Wt 131.0 lb

## 2021-08-10 DIAGNOSIS — B20 Human immunodeficiency virus [HIV] disease: Secondary | ICD-10-CM

## 2021-08-10 DIAGNOSIS — R7401 Elevation of levels of liver transaminase levels: Secondary | ICD-10-CM | POA: Diagnosis not present

## 2021-08-10 DIAGNOSIS — Z23 Encounter for immunization: Secondary | ICD-10-CM

## 2021-08-10 NOTE — Assessment & Plan Note (Signed)
He is doing well now on Biktarvy and will check his labs again today to monitor viral suppression and his CD4 count.  He otherwise will continue and I will have him return in 2 months.

## 2021-08-10 NOTE — Assessment & Plan Note (Signed)
Mild and hepatitis B and C negative.  May be from the HIV or other such as fatty liver.

## 2021-08-10 NOTE — Progress Notes (Signed)
° °  Subjective:    Patient ID: Todd Fry, male    DOB: 1976-01-10, 46 y.o.   MRN: [id number]  HPI Here for follow up of HIV He had been out of care for many years and was seen last month and interested in getting back in care.  Initially CD4 of 225 and viral load 316,000.  Some mild transaminitis.  He started [employer] and no issues with this.  No missed doses.      Review of Systems  Constitutional:  Negative for fatigue.  Gastrointestinal:  Negative for diarrhea and nausea.  Skin:  Negative for rash.      Objective:   Physical Exam Eyes:     General: No scleral icterus. Pulmonary:     Effort: Pulmonary effort is normal.  Skin:    Findings: No rash.  Neurological:     General: No focal deficit present.     Mental Status: He is alert.  Psychiatric:        Mood and Affect: Mood normal.   SH: + tobacco       Assessment & Plan:

## 2021-08-10 NOTE — Assessment & Plan Note (Signed)
Discussed Menveo and given today Also hepatitis B #1 Will repeat both next visit

## 2021-08-12 LAB — HEPATIC FUNCTION PANEL
AG Ratio: 0.9 (calc) — ABNORMAL LOW (ref 1.0–2.5)
ALT: 18 U/L (ref 9–46)
AST: 22 U/L (ref 10–40)
Albumin: 4 g/dL (ref 3.6–5.1)
Alkaline phosphatase (APISO): 110 U/L (ref 36–130)
Bilirubin, Direct: 0.1 mg/dL (ref 0.0–0.2)
Globulin: 4.6 g/dL (calc) — ABNORMAL HIGH (ref 1.9–3.7)
Indirect Bilirubin: 0.4 mg/dL (calc) (ref 0.2–1.2)
Total Bilirubin: 0.5 mg/dL (ref 0.2–1.2)
Total Protein: 8.6 g/dL — ABNORMAL HIGH (ref 6.1–8.1)

## 2021-08-12 LAB — T-HELPER CELLS (CD4) COUNT (NOT AT ARMC)
Absolute CD4: 328 cells/uL — ABNORMAL LOW (ref 490–1740)
CD4 T Helper %: 15 % — ABNORMAL LOW (ref 30–61)
Total lymphocyte count: 2178 cells/uL (ref 850–3900)

## 2021-08-12 LAB — HIV-1 RNA QUANT-NO REFLEX-BLD
HIV 1 RNA Quant: 89 Copies/mL — ABNORMAL HIGH
HIV-1 RNA Quant, Log: 1.95 Log cps/mL — ABNORMAL HIGH

## 2021-10-12 ENCOUNTER — Ambulatory Visit: Payer: BC Managed Care – PPO | Admitting: Internal Medicine

## 2021-11-02 ENCOUNTER — Other Ambulatory Visit: Payer: Self-pay

## 2021-11-02 ENCOUNTER — Ambulatory Visit: Payer: BC Managed Care – PPO | Admitting: Internal Medicine

## 2021-11-02 ENCOUNTER — Encounter: Payer: Self-pay | Admitting: Internal Medicine

## 2021-11-02 VITALS — BP 133/91 | HR 100 | Temp 98.5°F | Wt 133.0 lb

## 2021-11-02 DIAGNOSIS — Z23 Encounter for immunization: Secondary | ICD-10-CM

## 2021-11-02 DIAGNOSIS — Z720 Tobacco use: Secondary | ICD-10-CM

## 2021-11-02 DIAGNOSIS — B20 Human immunodeficiency virus [HIV] disease: Secondary | ICD-10-CM | POA: Diagnosis not present

## 2021-11-02 NOTE — Progress Notes (Signed)
? ?  Subjective:  ? ? Patient ID: Todd Fry, male    DOB: 24-Jun-1976, 46 y.o.   MRN: [id number] ? ?HPI ?Here for follow up of HIV ?He had been out of care but came back earlier this year and started on Biktarvy.  Prior to that he had not been in care since 2014.  CD4 initially 225 and up to 338 last visit and viral load 316,000 > 86.  Some issues initially with sleep disturbance with Biktarvy but ok now.  No complaints.   ? ? ?Review of Systems  ?Constitutional:  Negative for fatigue.  ?Gastrointestinal:  Negative for diarrhea and nausea.  ?Skin:  Negative for rash.  ? ?   ?Objective:  ? Physical Exam ?Eyes:  ?   General: No scleral icterus. ?Pulmonary:  ?   Effort: Pulmonary effort is normal.  ?Skin: ?   Findings: No rash.  ?Neurological:  ?   Mental Status: He is alert.  ? ?SH: + tobacco ? ? ? ?   ?Assessment & Plan:  ? ? ?

## 2021-11-02 NOTE — Assessment & Plan Note (Signed)
Discussed cessation 

## 2021-11-02 NOTE — Addendum Note (Signed)
Addended by: Tomi Bamberger on: 11/02/2021 03:48 PM ? ? Modules accepted: Orders ? ?

## 2021-11-02 NOTE — Assessment & Plan Note (Signed)
He is doing well now on Biktarvy and no issues with the medication.  He is seeing good results with a suppressed virus and improving CD4.  He will continue on medication and rtc in 4 months.  ?

## 2021-11-02 NOTE — Assessment & Plan Note (Signed)
Discussed the hepatitis B and Menveo vaccines and given today ?

## 2021-11-03 LAB — T-HELPER CELL (CD4) - (RCID CLINIC ONLY)
CD4 % Helper T Cell: 16 % — ABNORMAL LOW (ref 33–65)
CD4 T Cell Abs: 281 /uL — ABNORMAL LOW (ref 400–1790)

## 2021-11-04 LAB — HIV-1 RNA QUANT-NO REFLEX-BLD
HIV 1 RNA Quant: 30 copies/mL — ABNORMAL HIGH
HIV-1 RNA Quant, Log: 1.48 Log copies/mL — ABNORMAL HIGH

## 2022-03-08 ENCOUNTER — Ambulatory Visit (INDEPENDENT_AMBULATORY_CARE_PROVIDER_SITE_OTHER): Payer: BC Managed Care – PPO | Admitting: Internal Medicine

## 2022-03-08 ENCOUNTER — Other Ambulatory Visit: Payer: Self-pay

## 2022-03-08 ENCOUNTER — Encounter: Payer: Self-pay | Admitting: Internal Medicine

## 2022-03-08 VITALS — BP 147/81 | HR 109 | Temp 97.8°F | Wt 138.0 lb

## 2022-03-08 DIAGNOSIS — Z720 Tobacco use: Secondary | ICD-10-CM

## 2022-03-08 DIAGNOSIS — F17210 Nicotine dependence, cigarettes, uncomplicated: Secondary | ICD-10-CM | POA: Diagnosis not present

## 2022-03-08 DIAGNOSIS — Z7185 Encounter for immunization safety counseling: Secondary | ICD-10-CM

## 2022-03-08 DIAGNOSIS — B20 Human immunodeficiency virus [HIV] disease: Secondary | ICD-10-CM | POA: Diagnosis not present

## 2022-03-08 DIAGNOSIS — Z23 Encounter for immunization: Secondary | ICD-10-CM | POA: Diagnosis not present

## 2022-03-08 MED ORDER — BICTEGRAVIR-EMTRICITAB-TENOFOV 50-200-25 MG PO TABS: 1.0000 | ORAL_TABLET | Freq: Every day | ORAL | 11 refills | Status: DC

## 2022-03-08 NOTE — Progress Notes (Signed)
   Subjective:    Patient ID: Todd Fry, male    DOB: 1975-09-25, 46 y.o.   MRN: [id number]  HPI Here for follow up of HIV He continues on Biktarvy now and his viral load has been declining nicely and CD4 over 200.  He is taking it daily with no missed doses.  Has gained some weight and no rashes or concerns.    Review of Systems  Constitutional:  Negative for fatigue.  Gastrointestinal:  Negative for diarrhea.  Skin:  Negative for rash.       Objective:   Physical Exam Eyes:     General: No scleral icterus. Pulmonary:     Effort: Pulmonary effort is normal.  Neurological:     General: No focal deficit present.     Mental Status: He is alert.  Psychiatric:        Mood and Affect: Mood normal.   SH: + tobacco        Assessment & Plan:

## 2022-03-08 NOTE — Assessment & Plan Note (Signed)
Discussed menveo and #2 given today 

## 2022-03-08 NOTE — Assessment & Plan Note (Signed)
He continues to do well now that he is back in care and last viral load down to just 30.  I will recheck his labs today and he can continue on Biktarvy with no changes indicated.  He can follow up in 6 months unless there are concerns on today's labs.

## 2022-03-08 NOTE — Assessment & Plan Note (Signed)
Discussed cessation 

## 2022-03-09 LAB — T-HELPER CELL (CD4) - (RCID CLINIC ONLY)
CD4 % Helper T Cell: 16 % — ABNORMAL LOW (ref 33–65)
CD4 T Cell Abs: 358 /uL — ABNORMAL LOW (ref 400–1790)

## 2022-03-10 LAB — HIV-1 RNA QUANT-NO REFLEX-BLD
HIV 1 RNA Quant: NOT DETECTED Copies/mL
HIV-1 RNA Quant, Log: NOT DETECTED Log cps/mL

## 2022-09-06 ENCOUNTER — Ambulatory Visit: Payer: BC Managed Care – PPO | Admitting: Internal Medicine

## 2022-09-12 ENCOUNTER — Ambulatory Visit: Payer: BC Managed Care – PPO | Admitting: Internal Medicine

## 2022-09-29 ENCOUNTER — Other Ambulatory Visit: Payer: Self-pay

## 2022-09-29 ENCOUNTER — Ambulatory Visit (INDEPENDENT_AMBULATORY_CARE_PROVIDER_SITE_OTHER): Payer: BC Managed Care – PPO | Admitting: Internal Medicine

## 2022-09-29 ENCOUNTER — Encounter: Payer: Self-pay | Admitting: Internal Medicine

## 2022-09-29 VITALS — BP 133/87 | HR 97 | Temp 97.9°F | Resp 16 | Ht 66.0 in | Wt 139.0 lb

## 2022-09-29 DIAGNOSIS — Z720 Tobacco use: Secondary | ICD-10-CM

## 2022-09-29 DIAGNOSIS — B20 Human immunodeficiency virus [HIV] disease: Secondary | ICD-10-CM | POA: Diagnosis not present

## 2022-09-29 DIAGNOSIS — Z79899 Other long term (current) drug therapy: Secondary | ICD-10-CM

## 2022-09-29 DIAGNOSIS — Z113 Encounter for screening for infections with a predominantly sexual mode of transmission: Secondary | ICD-10-CM | POA: Diagnosis not present

## 2022-09-29 LAB — CBC WITH DIFFERENTIAL/PLATELET
Basophils Relative: 0.6 %
Eosinophils Absolute: 117 cells/uL (ref 15–500)
HCT: 43.6 % (ref 38.5–50.0)
Hemoglobin: 14.4 g/dL (ref 13.2–17.1)
MCV: 71.9 fL — ABNORMAL LOW (ref 80.0–100.0)
Monocytes Relative: 4.2 %
Neutro Abs: 5697 cells/uL (ref 1500–7800)
RDW: 16.2 % — ABNORMAL HIGH (ref 11.0–15.0)
WBC: 9 10*3/uL (ref 3.8–10.8)

## 2022-09-29 MED ORDER — BICTEGRAVIR-EMTRICITAB-TENOFOV 50-200-25 MG PO TABS: 1.0000 | ORAL_TABLET | Freq: Every day | ORAL | 11 refills | Status: DC

## 2022-09-29 MED ORDER — ROSUVASTATIN CALCIUM 10 MG PO TABS: 10.0000 mg | ORAL_TABLET | Freq: Every day | ORAL | 11 refills | Status: DC

## 2022-09-29 NOTE — Assessment & Plan Note (Signed)
Will check his lipid panel  With new research coming out by way of the REPRIEVE study regarding cardiovascular event risk reduction for PLWH, I discussed that over the 8 year trial initiation of statin (pitavastatin) therapy was shown to reduce CV disease by 35% independent of other risk factors.   The 10-year ASCVD risk score (Arnett DK, et al., 2019) is: 9%   Values used to calculate the score:     Age: 47 years     Sex: Male     Is Non-Hispanic African American: Yes     Diabetic: No     Tobacco smoker: Yes     Systolic Blood Pressure: 133 mmHg     Is BP treated: No     HDL Cholesterol: 32 mg/dL     Total Cholesterol: 169 mg/dL  We discussed the patient's 10-year CVD Risk Score to be at least moderately elevated, and would benefit from intervention given HIV+ and > 36 yo.   Will proceed with lower-intensity statin (pitavastatin 4 mg preferred) given non-diabetic.

## 2022-09-29 NOTE — Assessment & Plan Note (Signed)
Discussed cessation and he is contemplative, working on a plan.

## 2022-09-29 NOTE — Assessment & Plan Note (Signed)
Will screen Condoms provided 

## 2022-09-29 NOTE — Progress Notes (Signed)
   Subjective:    Patient ID: Todd Fry, male    DOB: 10-10-1975, 47 y.o.   MRN: [id number]  HPI Todd Fry is here for follow up of HIV He continues on Bitarvy and no missed doses.  No new issues.  No issues getting or taking his medication.    Review of Systems  Constitutional:  Negative for fatigue.  Gastrointestinal:  Negative for diarrhea and nausea.  Skin:  Negative for rash.       Objective:   Physical Exam Eyes:     General: No scleral icterus. Pulmonary:     Effort: Pulmonary effort is normal.  Neurological:     Mental Status: He is alert.   SH: + tobacco        Assessment & Plan:

## 2022-09-29 NOTE — Addendum Note (Signed)
Addended by: Marcell Anger on: 09/29/2022 11:53 AM   Modules accepted: Orders

## 2022-09-29 NOTE — Assessment & Plan Note (Signed)
He is doing well, no concerns and labs today. No changes indicated and he can rtc in 6 months.

## 2022-09-30 LAB — COMPLETE METABOLIC PANEL WITH GFR
ALT: 14 U/L (ref 9–46)
BUN: 11 mg/dL (ref 7–25)
Calcium: 9.3 mg/dL (ref 8.6–10.3)
Globulin: 3.2 g/dL (calc) (ref 1.9–3.7)
Glucose, Bld: 114 mg/dL — ABNORMAL HIGH (ref 65–99)
Sodium: 139 mmol/L (ref 135–146)

## 2022-09-30 LAB — C. TRACHOMATIS/N. GONORRHOEAE RNA
C. trachomatis RNA, TMA: NOT DETECTED
N. gonorrhoeae RNA, TMA: NOT DETECTED

## 2022-09-30 LAB — LIPID PANEL
HDL: 42 mg/dL (ref >=40)
Total CHOL/HDL Ratio: 4.5 (calc) (ref <5.0)

## 2022-10-01 LAB — COMPLETE METABOLIC PANEL WITH GFR
AG Ratio: 1.3 (calc) (ref 1.0–2.5)
AST: 19 U/L (ref 10–40)
Albumin: 4 g/dL (ref 3.6–5.1)
Alkaline phosphatase (APISO): 86 U/L (ref 36–130)
CO2: 24 mmol/L (ref 20–32)
Chloride: 106 mmol/L (ref 98–110)
Creat: 0.84 mg/dL (ref 0.60–1.29)
Potassium: 4.4 mmol/L (ref 3.5–5.3)
Total Bilirubin: 0.4 mg/dL (ref 0.2–1.2)
Total Protein: 7.2 g/dL (ref 6.1–8.1)
eGFR: 108 mL/min/{1.73_m2} (ref >=60)

## 2022-10-01 LAB — CBC WITH DIFFERENTIAL/PLATELET
Absolute Monocytes: 378 cells/uL (ref 200–950)
Basophils Absolute: 54 cells/uL (ref 0–200)
Eosinophils Relative: 1.3 %
Lymphs Abs: 2754 cells/uL (ref 850–3900)
MCH: 23.8 pg — ABNORMAL LOW (ref 27.0–33.0)
MCHC: 33 g/dL (ref 32.0–36.0)
MPV: 9.9 fL (ref 7.5–12.5)
Neutrophils Relative %: 63.3 %
Platelets: 352 10*3/uL (ref 140–400)
RBC: 6.06 10*6/uL — ABNORMAL HIGH (ref 4.20–5.80)
Total Lymphocyte: 30.6 %

## 2022-10-01 LAB — T-HELPER CELLS (CD4) COUNT (NOT AT ARMC)
Absolute CD4: 582 cells/uL (ref 490–1740)
CD4 T Helper %: 22 % — ABNORMAL LOW (ref 30–61)
Total lymphocyte count: 2646 cells/uL (ref 850–3900)

## 2022-10-01 LAB — HIV-1 RNA QUANT-NO REFLEX-BLD
HIV 1 RNA Quant: <20 Copies/mL — ABNORMAL HIGH
HIV-1 RNA Quant, Log: <1.3 Log cps/mL — ABNORMAL HIGH

## 2022-10-01 LAB — LIPID PANEL
Cholesterol: 189 mg/dL (ref <200)
LDL Cholesterol (Calc): 118 mg/dL (calc) — ABNORMAL HIGH
Non-HDL Cholesterol (Calc): 147 mg/dL (calc) — ABNORMAL HIGH (ref <130)
Triglycerides: 173 mg/dL — ABNORMAL HIGH (ref <150)

## 2022-10-01 LAB — RPR: RPR Ser Ql: NONREACTIVE

## 2023-03-27 ENCOUNTER — Ambulatory Visit: Payer: BC Managed Care – PPO | Admitting: Internal Medicine

## 2023-04-05 ENCOUNTER — Other Ambulatory Visit: Payer: Self-pay

## 2023-04-05 ENCOUNTER — Encounter: Payer: Self-pay | Admitting: Internal Medicine

## 2023-04-05 ENCOUNTER — Ambulatory Visit: Payer: BC Managed Care – PPO | Admitting: Internal Medicine

## 2023-04-05 VITALS — BP 135/87 | HR 83 | Temp 97.6°F | Resp 16 | Wt 140.6 lb

## 2023-04-05 DIAGNOSIS — F17210 Nicotine dependence, cigarettes, uncomplicated: Secondary | ICD-10-CM

## 2023-04-05 DIAGNOSIS — B20 Human immunodeficiency virus [HIV] disease: Secondary | ICD-10-CM

## 2023-04-05 DIAGNOSIS — Z720 Tobacco use: Secondary | ICD-10-CM

## 2023-04-05 DIAGNOSIS — Z23 Encounter for immunization: Secondary | ICD-10-CM | POA: Diagnosis not present

## 2023-04-05 DIAGNOSIS — Z79899 Other long term (current) drug therapy: Secondary | ICD-10-CM

## 2023-04-05 NOTE — Progress Notes (Signed)
Subjective:    Patient ID: Todd Fry, male    DOB: 1975-08-16, 47 y.o.   MRN: [id number]  HPI Todd Fry is here for follow up of HIV He continues on Coyle and denies any missed doses.  No issues with getting or taking his medication.     Review of Systems  Constitutional:  Negative for fatigue.  Gastrointestinal:  Negative for diarrhea and nausea.  Skin:  Negative for rash.       Objective:   Physical Exam Eyes:     General: No scleral icterus. Pulmonary:     Effort: Pulmonary effort is normal.  Neurological:     Mental Status: He is alert.   SH: + tobacco        Assessment & Plan:

## 2023-04-06 ENCOUNTER — Encounter: Payer: Self-pay | Admitting: Internal Medicine

## 2023-04-06 LAB — T-HELPER CELL (CD4) - (RCID CLINIC ONLY)
CD4 % Helper T Cell: 20 % — ABNORMAL LOW (ref 33–65)
CD4 T Cell Abs: 567 /uL (ref 400–1790)

## 2023-04-06 NOTE — Assessment & Plan Note (Signed)
Discussed flu and COVID vacccines and given at the visit

## 2023-04-06 NOTE — Assessment & Plan Note (Signed)
Now on a statin per the Reprieve trial and tolerating well.   Will continue

## 2023-04-06 NOTE — Assessment & Plan Note (Signed)
He continues doing well on Biktarvy with no concerns.  Previous labs reviewed with him. Refills provided.  Labs today and he can rtc in 6 months.

## 2023-04-06 NOTE — Assessment & Plan Note (Signed)
Discussed cessation and he is cutting down and interested in quitting.

## 2023-04-08 LAB — HIV-1 RNA QUANT-NO REFLEX-BLD
HIV 1 RNA Quant: 32 {copies}/mL — ABNORMAL HIGH
HIV-1 RNA Quant, Log: 1.51 {Log} — ABNORMAL HIGH

## 2023-07-02 IMAGING — CR DG CHEST 2V
2 series · 2 of 2 positions shown · non-contrast
Comparison: None.

CLINICAL DATA: Hiccups x6 days.

EXAM:
CHEST - 2 VIEW

[chest pa]
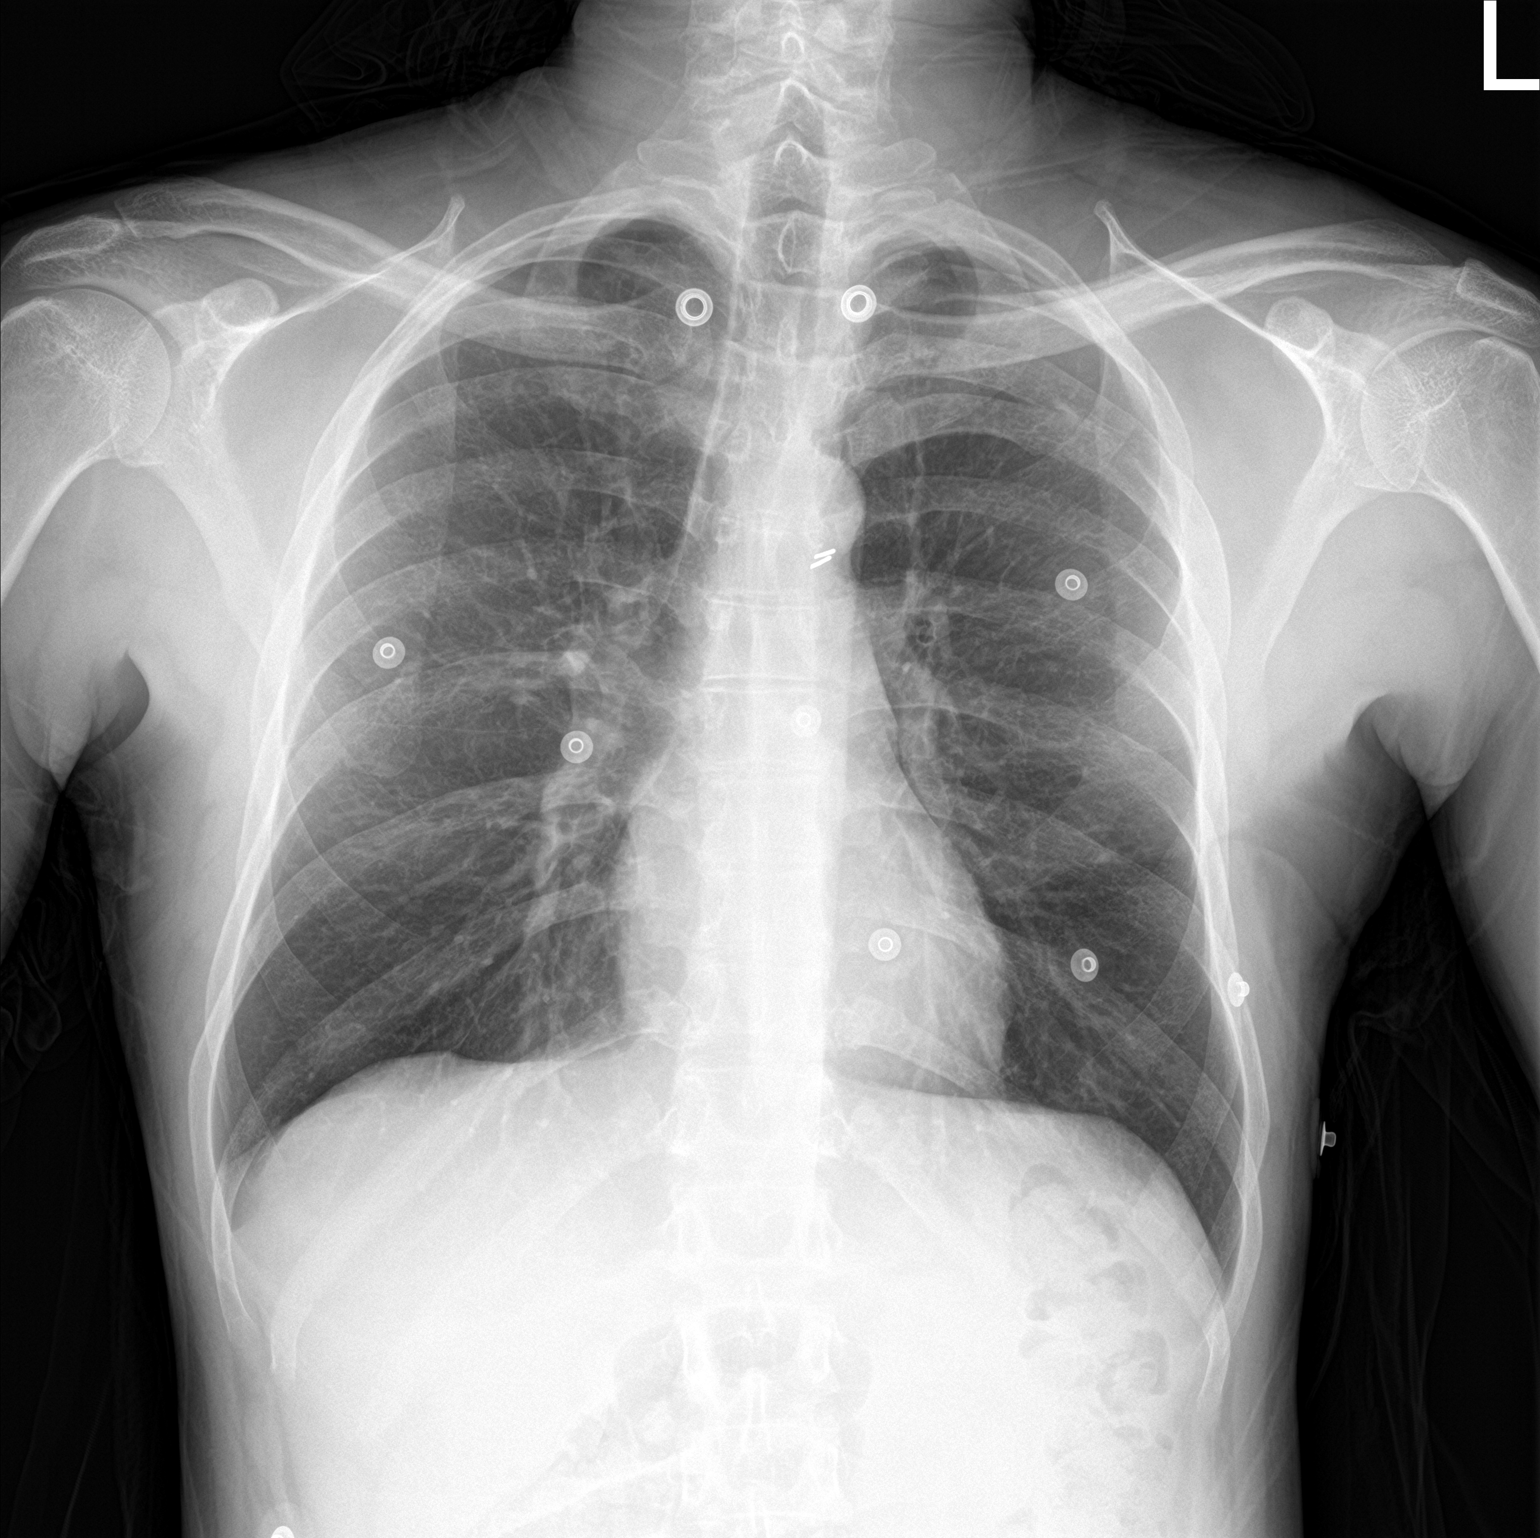

[chest lat]
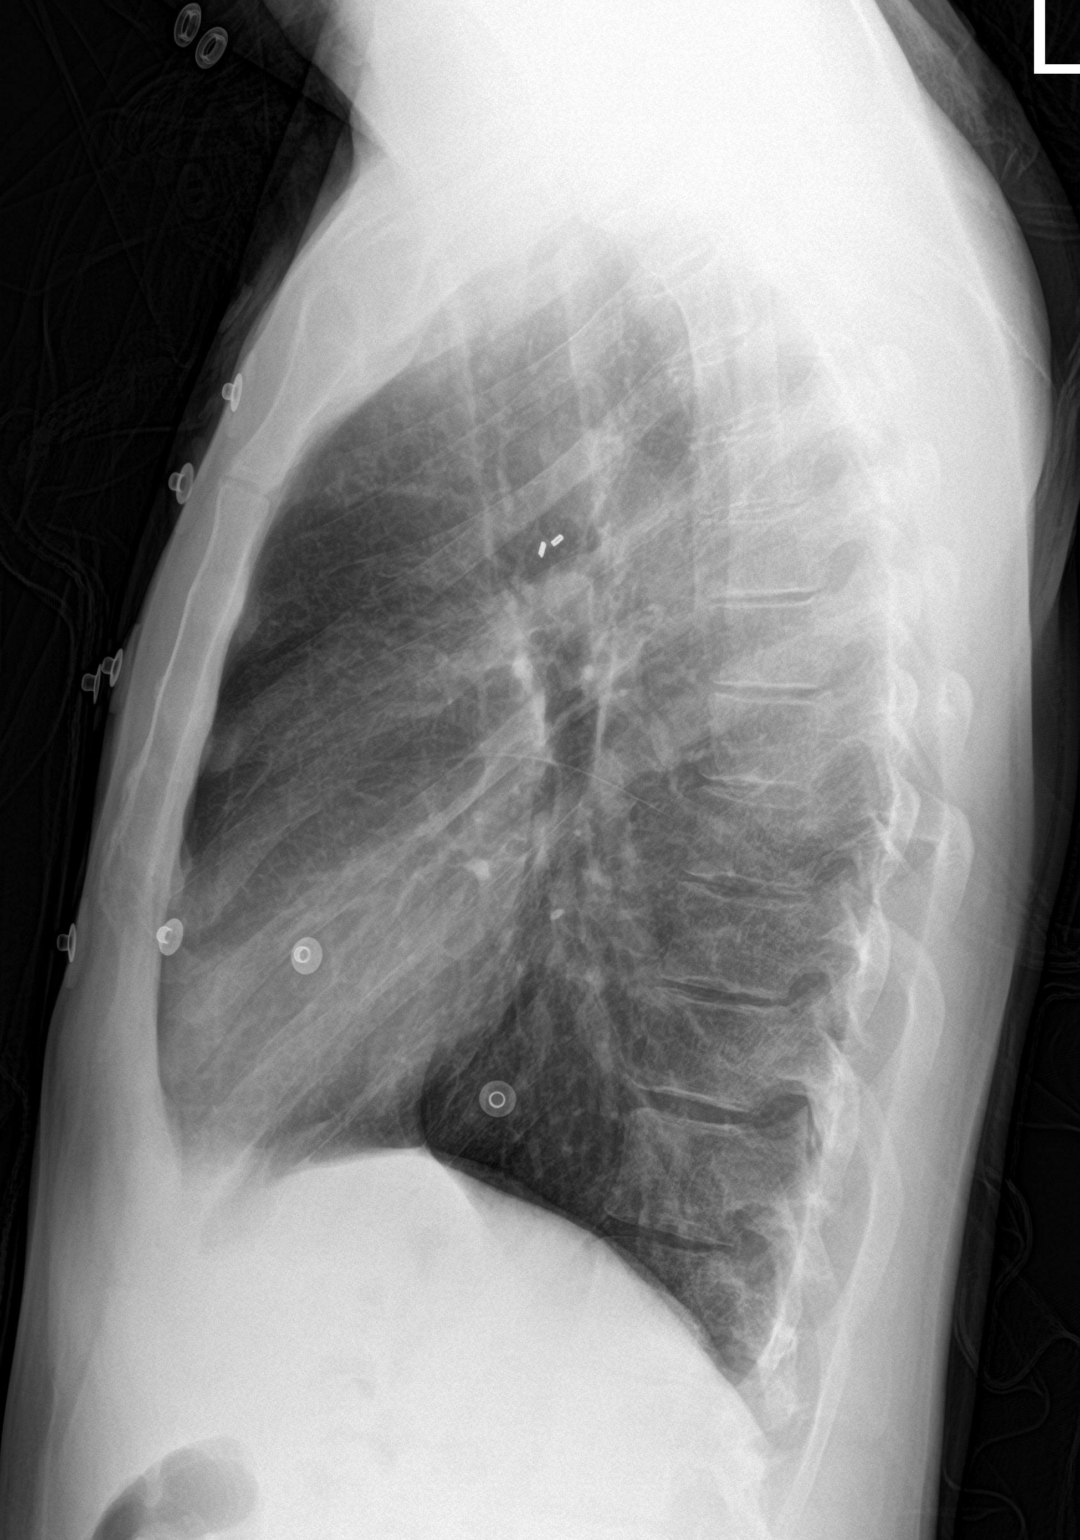

[2 of 2 positions shown; findings below may reference images not displayed]

FINDINGS: The heart size and mediastinal contours are within normal limits.
Small radiopaque surgical clips are seen just below the aortic arch.
Both lungs are clear. The visualized skeletal structures are
unremarkable.
IMPRESSION: No active cardiopulmonary disease.

## 2023-10-03 NOTE — Progress Notes (Unsigned)
   HPI: Todd Fry is a 48 y.o. male who presents to the [facility] pharmacy clinic for HIV follow-up.  Patient Active Problem List   Diagnosis Date Noted   Tobacco abuse 11/02/2021   Transaminitis 08/10/2021   Need for vaccination 08/10/2021   Encounter for long-term (current) use of high-risk medication 07/05/2021   Routine screening for STI (sexually transmitted infection) 07/05/2021   Human immunodeficiency virus (HIV) disease (HCC) 10/11/2006    Patient's Medications  New Prescriptions   No medications on file  Previous Medications   BICTEGRAVIR-EMTRICITABINE-TENOFOVIR AF (BIKTARVY) 50-200-25 MG TABS TABLET    Take 1 tablet by mouth daily.   ROSUVASTATIN (CRESTOR) 10 MG TABLET    Take 1 tablet (10 mg total) by mouth daily.  Modified Medications   No medications on file  Discontinued Medications   No medications on file    Labs: Lab Results  Component Value Date   HIV1RNAQUANT 32 (H) 04/05/2023   HIV1RNAQUANT <20 (H) 09/29/2022   HIV1RNAQUANT Not Detected 03/08/2022   HIV1RNAVL 198 01/16/2012   HIV1RNAVL 33,106 09/15/2011   HIV1RNAVL 18,934 04/03/2011   CD4TABS 567 04/05/2023   CD4TABS 358 (L) 03/08/2022   CD4TABS 281 (L) 11/02/2021    RPR and STI Lab Results  Component Value Date   LABRPR NON-REACTIVE 09/29/2022   LABRPR NON-REACTIVE 07/05/2021   LABRPR NON REAC 12/09/2009   LABRPR NON REAC 06/12/2007    STI Results GC CT  07/05/2021 10:20 AM Positive  Positive     Hepatitis B Lab Results  Component Value Date   HEPBSAB NON-REACTIVE 07/05/2021   HEPBSAG NON-REACTIVE 07/05/2021   HEPBCAB NEG 09/15/2011   Hepatitis C Lab Results  Component Value Date   HEPCAB NON-REACTIVE 07/05/2021   Hepatitis A Lab Results  Component Value Date   HAV NEG 09/15/2011   Lipids: Lab Results  Component Value Date   CHOL 189 09/29/2022   TRIG 173 (H) 09/29/2022   HDL 42 09/29/2022   CHOLHDL 4.5 09/29/2022   VLDL 11 01/16/2012   LDLCALC 118 (H) 09/29/2022     Current HIV Regimen: Biktarvy, fill history demonstrated good adherence.  Assessment: Todd Fry is a patient of Dr Ephriam Knuckles (since 2013) who presents for HIV follow-up on Biktarvy. Last HIV RNA was undetectable on April 2024. Last CD4 was 582 in April 2024.   He is due for annual labs including CMP, CBC, and lipid panel. LDL elevated when last checked in April 2024, has since started rosuvastatin 10 mg daily. Due for Shingles vaccine, agrees to vaccination today. Scheduled for second dose on 12/05/2023.   Plan: - CMP, CBC, lipid panel, RPR today - Continue Biktarvy, rosuvastatin refills sent to [facility] - Check HAV and HBV antibodies for post-vaccination immunity - Annual HIV RNA & CD4 today - Shingrix dose 1 today, scheduled for dose 2 in June. - Scheduled new provider follow up with Dr Thedore Mins on 09/24/2024.  [name] PharmD Candidate

## 2023-10-04 ENCOUNTER — Ambulatory Visit: Payer: BC Managed Care – PPO | Admitting: Internal Medicine

## 2023-10-04 ENCOUNTER — Ambulatory Visit (INDEPENDENT_AMBULATORY_CARE_PROVIDER_SITE_OTHER): Admitting: Pharmacist

## 2023-10-04 ENCOUNTER — Other Ambulatory Visit: Payer: Self-pay

## 2023-10-04 DIAGNOSIS — Z23 Encounter for immunization: Secondary | ICD-10-CM | POA: Diagnosis not present

## 2023-10-04 DIAGNOSIS — B20 Human immunodeficiency virus [HIV] disease: Secondary | ICD-10-CM

## 2023-10-04 MED ORDER — BICTEGRAVIR-EMTRICITAB-TENOFOV 50-200-25 MG PO TABS: 1.0000 | ORAL_TABLET | Freq: Every day | ORAL | 11 refills | Status: AC

## 2023-10-04 MED ORDER — ROSUVASTATIN CALCIUM 10 MG PO TABS: 10.0000 mg | ORAL_TABLET | Freq: Every day | ORAL | 11 refills | Status: AC

## 2023-10-05 LAB — T-HELPER CELLS (CD4) COUNT (NOT AT ARMC)
CD4 % Helper T Cell: 26 % — ABNORMAL LOW (ref 33–65)
CD4 T Cell Abs: 625 /uL (ref 400–1790)

## 2023-10-07 LAB — RPR: RPR Ser Ql: NONREACTIVE

## 2023-10-07 LAB — COMPREHENSIVE METABOLIC PANEL WITH GFR
AG Ratio: 1.3 (calc) (ref 1.0–2.5)
ALT: 14 U/L (ref 9–46)
AST: 20 U/L (ref 10–40)
Albumin: 4 g/dL (ref 3.6–5.1)
Alkaline phosphatase (APISO): 65 U/L (ref 36–130)
BUN: 12 mg/dL (ref 7–25)
CO2: 23 mmol/L (ref 20–32)
Calcium: 9.5 mg/dL (ref 8.6–10.3)
Chloride: 107 mmol/L (ref 98–110)
Creat: 0.86 mg/dL (ref 0.60–1.29)
Globulin: 3.2 g/dL (ref 1.9–3.7)
Glucose, Bld: 85 mg/dL (ref 65–99)
Potassium: 4.3 mmol/L (ref 3.5–5.3)
Sodium: 140 mmol/L (ref 135–146)
Total Bilirubin: 0.4 mg/dL (ref 0.2–1.2)
Total Protein: 7.2 g/dL (ref 6.1–8.1)
eGFR: 107 mL/min/{1.73_m2} (ref >=60)

## 2023-10-07 LAB — CBC WITH DIFFERENTIAL/PLATELET
Absolute Lymphocytes: 2740 {cells}/uL (ref 850–3900)
Absolute Monocytes: 509 {cells}/uL (ref 200–950)
Basophils Absolute: 40 {cells}/uL (ref 0–200)
Basophils Relative: 0.6 %
Eosinophils Absolute: 201 {cells}/uL (ref 15–500)
Eosinophils Relative: 3 %
HCT: 42.2 % (ref 38.5–50.0)
Hemoglobin: 13.7 g/dL (ref 13.2–17.1)
MCH: 24 pg — ABNORMAL LOW (ref 27.0–33.0)
MCHC: 32.5 g/dL (ref 32.0–36.0)
MCV: 74 fL — ABNORMAL LOW (ref 80.0–100.0)
MPV: 9.6 fL (ref 7.5–12.5)
Monocytes Relative: 7.6 %
Neutro Abs: 3209 {cells}/uL (ref 1500–7800)
Neutrophils Relative %: 47.9 %
Platelets: 370 10*3/uL (ref 140–400)
RBC: 5.7 10*6/uL (ref 4.20–5.80)
RDW: 15.8 % — ABNORMAL HIGH (ref 11.0–15.0)
Total Lymphocyte: 40.9 %
WBC: 6.7 10*3/uL (ref 3.8–10.8)

## 2023-10-07 LAB — LIPID PANEL
Cholesterol: 214 mg/dL — ABNORMAL HIGH (ref <200)
HDL: 51 mg/dL (ref >=40)
LDL Cholesterol (Calc): 143 mg/dL — ABNORMAL HIGH
Non-HDL Cholesterol (Calc): 163 mg/dL — ABNORMAL HIGH (ref <130)
Total CHOL/HDL Ratio: 4.2 (calc) (ref <5.0)
Triglycerides: 96 mg/dL (ref <150)

## 2023-10-07 LAB — HIV-1 RNA QUANT-NO REFLEX-BLD
HIV 1 RNA Quant: NOT DETECTED {copies}/mL
HIV-1 RNA Quant, Log: NOT DETECTED {Log_copies}/mL

## 2023-10-07 LAB — HEPATITIS B SURFACE ANTIBODY,QUALITATIVE: Hep B S Ab: REACTIVE — AB

## 2023-10-07 LAB — HEPATITIS A ANTIBODY, TOTAL: Hepatitis A AB,Total: NONREACTIVE

## 2023-12-04 NOTE — Progress Notes (Deleted)
   HPI: Todd Fry is a 48 y.o. male who presents to the [facility] pharmacy clinic for HIV follow-up and shingles vaccine administration.   Patient Active Problem List   Diagnosis Date Noted   Tobacco abuse 11/02/2021   Transaminitis 08/10/2021   Need for vaccination 08/10/2021   Encounter for long-term (current) use of high-risk medication 07/05/2021   Routine screening for STI (sexually transmitted infection) 07/05/2021   Human immunodeficiency virus (HIV) disease (HCC) 10/11/2006    Patient's Medications  New Prescriptions   No medications on file  Previous Medications   BICTEGRAVIR-EMTRICITABINE -TENOFOVIR  AF (BIKTARVY) 50-200-25 MG TABS TABLET    Take 1 tablet by mouth daily.   ROSUVASTATIN  (CRESTOR ) 10 MG TABLET    Take 1 tablet (10 mg total) by mouth daily.  Modified Medications   No medications on file  Discontinued Medications   No medications on file    Labs: Lab Results  Component Value Date   HIV1RNAQUANT NOT DETECTED 10/04/2023   HIV1RNAQUANT 32 (H) 04/05/2023   HIV1RNAQUANT <20 (H) 09/29/2022   HIV1RNAVL 198 01/16/2012   HIV1RNAVL 33,106 09/15/2011   HIV1RNAVL 18,934 04/03/2011   CD4TABS 625 10/04/2023   CD4TABS 567 04/05/2023   CD4TABS 358 (L) 03/08/2022    RPR and STI Lab Results  Component Value Date   LABRPR NON-REACTIVE 10/04/2023   LABRPR NON-REACTIVE 09/29/2022   LABRPR NON-REACTIVE 07/05/2021   LABRPR NON REAC 12/09/2009   LABRPR NON REAC 06/12/2007    STI Results GC CT  07/05/2021 10:20 AM Positive  Positive     Hepatitis B Lab Results  Component Value Date   HEPBSAB REACTIVE (A) 10/04/2023   HEPBSAG NON-REACTIVE 07/05/2021   HEPBCAB NEG 09/15/2011   Hepatitis C Lab Results  Component Value Date   HEPCAB NON-REACTIVE 07/05/2021   Hepatitis A Lab Results  Component Value Date   HAV NON-REACTIVE 10/04/2023   Lipids: Lab Results  Component Value Date   CHOL 214 (H) 10/04/2023   TRIG 96 10/04/2023   HDL 51 10/04/2023    CHOLHDL 4.2 10/04/2023   VLDL 11 01/16/2012   LDLCALC 143 (H) 10/04/2023    Current HIV Regimen: Biktarvy   Assessment: ***  Plan: ***   Jobie Mulders, PharmD [facility] PGY-1

## 2023-12-05 ENCOUNTER — Ambulatory Visit: Payer: Self-pay | Admitting: Pharmacist

## 2023-12-19 ENCOUNTER — Ambulatory Visit: Admitting: Pharmacist

## 2023-12-24 NOTE — Progress Notes (Unsigned)
   [facility] Vaccination Visit  HPI: Todd Fry is a 48 y.o. male who presents to the [facility] pharmacy clinic for vaccine administration.  Hepatitis B Lab Results  Component Value Date   HEPBSAB REACTIVE (A) 10/04/2023   Lab Results  Component Value Date   HEPBSAG NON-REACTIVE 07/05/2021    Hepatitis C Lab Results  Component Value Date   HCVAB NEGATIVE 09/15/2011    Hepatitis A Lab Results  Component Value Date   HAV NON-REACTIVE 10/04/2023    Assessment & Plan: - Administered second Shingrix  vaccine - Patient tolerated well  Ailanie Ruttan L. Carlisia Geno, PharmD, BCIDP, AAHIVP, CPP Clinical Pharmacist Practitioner - Infectious Diseases Clinical Pharmacist Lead - Specialty Pharmacy [facility] 12/24/2023, 5:06 PM

## 2023-12-25 ENCOUNTER — Other Ambulatory Visit: Payer: Self-pay

## 2023-12-25 ENCOUNTER — Ambulatory Visit (INDEPENDENT_AMBULATORY_CARE_PROVIDER_SITE_OTHER): Admitting: Pharmacist

## 2023-12-25 DIAGNOSIS — Z23 Encounter for immunization: Secondary | ICD-10-CM

## 2024-05-26 NOTE — Progress Notes (Signed)
 The ASCVD Risk score (Arnett DK, et al., 2019) failed to calculate for the following reasons:   The systolic blood pressure is missing  Todd Fry, BSN, Charity fundraiser

## 2024-09-24 ENCOUNTER — Ambulatory Visit: Admitting: Internal Medicine
